# Patient Record
Sex: Male | Born: 1970 | Hispanic: Yes | Marital: Single | Smoking: Former smoker
Health system: Southern US, Community
[De-identification: ages and names within clinical notes are randomized; demographics above are authoritative.]

## PROBLEM LIST (undated history)

## (undated) DIAGNOSIS — K5790 Diverticulosis of intestine, part unspecified, without perforation or abscess without bleeding: Secondary | ICD-10-CM

## (undated) DIAGNOSIS — K5792 Diverticulitis of intestine, part unspecified, without perforation or abscess without bleeding: Secondary | ICD-10-CM

## (undated) DIAGNOSIS — E785 Hyperlipidemia, unspecified: Secondary | ICD-10-CM

## (undated) HISTORY — DX: Hyperlipidemia, unspecified: E78.5

## (undated) HISTORY — DX: Diverticulosis of intestine, part unspecified, without perforation or abscess without bleeding: K57.90

---

## 2011-03-01 ENCOUNTER — Inpatient Hospital Stay (INDEPENDENT_AMBULATORY_CARE_PROVIDER_SITE_OTHER)
Admission: RE | Admit: 2011-03-01 | Discharge: 2011-03-01 | Disposition: A | Discharge status: Home / Self Care | Payer: BC Managed Care – PPO | Source: Ambulatory Visit | Attending: Family Medicine | Admitting: Family Medicine

## 2011-03-01 ENCOUNTER — Emergency Department (HOSPITAL_COMMUNITY)
Admission: EM | Admit: 2011-03-01 | Discharge: 2011-03-01 | Disposition: A | Discharge status: Home / Self Care | Payer: BC Managed Care – PPO | Attending: Emergency Medicine | Admitting: Emergency Medicine

## 2011-03-01 ENCOUNTER — Emergency Department (HOSPITAL_COMMUNITY): Payer: BC Managed Care – PPO

## 2011-03-01 DIAGNOSIS — K5732 Diverticulitis of large intestine without perforation or abscess without bleeding: Secondary | ICD-10-CM | POA: Insufficient documentation

## 2011-03-01 DIAGNOSIS — D72829 Elevated white blood cell count, unspecified: Secondary | ICD-10-CM | POA: Insufficient documentation

## 2011-03-01 DIAGNOSIS — R109 Unspecified abdominal pain: Secondary | ICD-10-CM | POA: Insufficient documentation

## 2011-03-01 LAB — COMPREHENSIVE METABOLIC PANEL
ALT: 21 U/L (ref 0–53)
AST: 17 U/L (ref 0–37)
Albumin: 4.2 g/dL (ref 3.5–5.2)
Alkaline Phosphatase: 67 U/L (ref 39–117)
BUN: 13 mg/dL (ref 6–23)
CO2: 28 mEq/L (ref 19–32)
Calcium: 9.9 mg/dL (ref 8.4–10.5)
Chloride: 98 mEq/L (ref 96–112)
Creatinine, Ser: 0.75 mg/dL (ref 0.4–1.5)
GFR calc Af Amer: >60 mL/min (ref >60)
GFR calc non Af Amer: >60 mL/min (ref >60)
Glucose, Bld: 110 mg/dL — ABNORMAL HIGH (ref 70–99)
Potassium: 4.3 mEq/L (ref 3.5–5.1)
Sodium: 136 mEq/L (ref 135–145)
Total Bilirubin: 1.1 mg/dL (ref 0.3–1.2)
Total Protein: 7.9 g/dL (ref 6.0–8.3)

## 2011-03-01 LAB — CBC
HCT: 41.7 % (ref 39.0–52.0)
Hemoglobin: 14.4 g/dL (ref 13.0–17.0)
MCH: 30.8 pg (ref 26.0–34.0)
MCHC: 34.5 g/dL (ref 30.0–36.0)
MCV: 89.3 fL (ref 78.0–100.0)
Platelets: 246 10*3/uL (ref 150–400)
RBC: 4.67 MIL/uL (ref 4.22–5.81)
RDW: 13.7 % (ref 11.5–15.5)
WBC: 17.5 10*3/uL — ABNORMAL HIGH (ref 4.0–10.5)

## 2011-03-01 LAB — DIFFERENTIAL
Basophils Absolute: 0 10*3/uL (ref 0.0–0.1)
Basophils Relative: 0 % (ref 0–1)
Eosinophils Absolute: 0.1 10*3/uL (ref 0.0–0.7)
Eosinophils Relative: 1 % (ref 0–5)
Lymphocytes Relative: 11 % — ABNORMAL LOW (ref 12–46)
Lymphs Abs: 1.8 10*3/uL (ref 0.7–4.0)
Monocytes Absolute: 1.7 10*3/uL — ABNORMAL HIGH (ref 0.1–1.0)
Monocytes Relative: 10 % (ref 3–12)
Neutro Abs: 13.8 10*3/uL — ABNORMAL HIGH (ref 1.7–7.7)
Neutrophils Relative %: 79 % — ABNORMAL HIGH (ref 43–77)

## 2011-03-01 LAB — URINALYSIS, ROUTINE W REFLEX MICROSCOPIC
Bilirubin Urine: NEGATIVE
Glucose, UA: NEGATIVE mg/dL
Ketones, ur: NEGATIVE mg/dL
Leukocytes, UA: NEGATIVE
Nitrite: NEGATIVE
Protein, ur: NEGATIVE mg/dL
Specific Gravity, Urine: 1 — ABNORMAL LOW (ref 1.005–1.030)
Urobilinogen, UA: 0.2 mg/dL (ref 0.0–1.0)
pH: 7 (ref 5.0–8.0)

## 2011-03-01 LAB — URINE MICROSCOPIC-ADD ON

## 2011-03-01 MED ORDER — IOHEXOL 300 MG/ML  SOLN
100.0000 mL | Freq: Once | INTRAMUSCULAR | Status: AC | PRN
  Administered 2011-03-01: 100 mL via INTRAVENOUS

## 2012-08-24 ENCOUNTER — Encounter (HOSPITAL_COMMUNITY): Payer: Self-pay

## 2012-08-24 ENCOUNTER — Emergency Department (HOSPITAL_COMMUNITY)
Admission: EM | Admit: 2012-08-24 | Discharge: 2012-08-24 | Disposition: A | Discharge status: Home / Self Care | Payer: BC Managed Care – PPO | Source: Home / Self Care

## 2012-08-24 DIAGNOSIS — K5792 Diverticulitis of intestine, part unspecified, without perforation or abscess without bleeding: Secondary | ICD-10-CM

## 2012-08-24 DIAGNOSIS — K5732 Diverticulitis of large intestine without perforation or abscess without bleeding: Secondary | ICD-10-CM

## 2012-08-24 LAB — POCT URINALYSIS DIP (DEVICE)
Bilirubin Urine: NEGATIVE
Glucose, UA: NEGATIVE mg/dL
Specific Gravity, Urine: 1.02 (ref 1.005–1.030)

## 2012-08-24 LAB — OCCULT BLOOD, POC DEVICE: Fecal Occult Bld: POSITIVE

## 2012-08-24 MED ORDER — METRONIDAZOLE 500 MG PO TABS: 500.0000 mg | ORAL_TABLET | Freq: Two times a day (BID) | ORAL | Status: DC

## 2012-08-24 MED ORDER — CIPROFLOXACIN HCL 500 MG PO TABS: 500.0000 mg | ORAL_TABLET | Freq: Two times a day (BID) | ORAL | Status: DC

## 2012-08-24 NOTE — ED Provider Notes (Signed)
History     CSN: [id number]  Arrival date & time 08/24/12  1006   None     Chief Complaint  Patient presents with  . Abdominal Pain    (Consider location/radiation/quality/duration/timing/severity/associated sxs/prior treatment) HPI Comments: 41 year old Hispanic presents to the emergency department with abdominal pain for 4 days. He states the pain radiates from the left lower quadrant to the right lower quadrant. The pain is worse in the left lower quadrant. The pain is intermittent, comes and goes. Occasionally the pain is worse upon eating; and sometimes improved after a bowel movement. He denies vomiting, although 4 days ago he did have a couple of episodes of loose stools in which she called diarrhea. He denies fever or urinary symptoms except for equivocal urinary frequency. There is constant low level of pain particularly in the left lower quadrant. He denies rectal bleeding. He denies changes in stools or BMs. He states his bowel movements have been normal with formed stools. He visited the urgent care in May of 2012 with similar complaints of abdominal pain. He states this is different in that he only her in the left lower quadrant and the intensity was not as bad as it is today. A CT of the abdomen was obtained at that time and the interpretation was that of diverticulitis of the sigmoid colon.   History reviewed. No pertinent past medical history.  History reviewed. No pertinent past surgical history.  History reviewed. No pertinent family history.  History  Substance Use Topics  . Smoking status: Never Smoker   . Smokeless tobacco: Not on file  . Alcohol Use: No      Review of Systems  Constitutional: Positive for appetite change. Negative for fever, diaphoresis, activity change and fatigue.  HENT: Negative for ear pain, congestion, sore throat, neck pain and neck stiffness.   Eyes: Negative.   Respiratory: Negative for cough, chest tightness, shortness of breath  and wheezing.   Cardiovascular: Negative for chest pain, palpitations and leg swelling.  Gastrointestinal: Positive for abdominal pain and diarrhea. Negative for vomiting, constipation, abdominal distention and rectal pain.  Genitourinary: Positive for frequency. Negative for dysuria, urgency, hematuria, flank pain, difficulty urinating and penile pain.  Musculoskeletal: Negative.   Skin: Negative for color change and rash.  Neurological: Negative.   Psychiatric/Behavioral: Negative.  Negative for behavioral problems.    Allergies  Review of patient's allergies indicates not on file.  Home Medications   Current Outpatient Rx  Name  Route  Sig  Dispense  Refill  . CIPROFLOXACIN HCL 500 MG PO TABS   Oral   Take 1 tablet (500 mg total) by mouth 2 (two) times daily.   14 tablet   0   . METRONIDAZOLE 500 MG PO TABS   Oral   Take 1 tablet (500 mg total) by mouth 2 (two) times daily. X 7 days   14 tablet   0     BP 130/82  Pulse 68  Temp 97.9 F (36.6 C) (Oral)  Resp 18  SpO2 99%  Physical Exam  Constitutional: He is oriented to person, place, and time. He appears well-developed and well-nourished. No distress.  HENT:  Head: Normocephalic and atraumatic.  Eyes: Conjunctivae normal and EOM are normal.  Neck: Normal range of motion. Neck supple.  Cardiovascular: Normal rate, regular rhythm and normal heart sounds.   Pulmonary/Chest: Effort normal and breath sounds normal. No respiratory distress. He has no wheezes.  Abdominal: Soft. He exhibits no distension and no mass.  There is tenderness. There is no rebound and no guarding.       Reproducible tenderness at the left lower quadrant. No tenderness in the mid or right lower quadrant. No tenderness above the umbilicus. Bowel sounds are normal active.  Musculoskeletal: Normal range of motion. He exhibits no edema and no tenderness.  Neurological: He is alert and oriented to person, place, and time. No cranial nerve deficit.    Skin: Skin is warm and dry. No rash noted.  Psychiatric: He has a normal mood and affect.    ED Course  Procedures (including critical care time)  Labs Reviewed  POCT URINALYSIS DIP (DEVICE) - Abnormal; Notable for the following:    Hgb urine dipstick TRACE (*)     All other components within normal limits  OCCULT BLOOD X 1 CARD TO LAB, STOOL   No results found.   1. Diverticulitis       MDM  Hemoccult was trace positive for blood. With the CT reading of May 2012 interpretation as diverticulitis and a similar clinical presentation today is most likely an exacerbation of his sigmoid diverticulitis. Flagyl 500 mg twice a day for 7 days Cipro 500 mg twice a day for 7 days Instructions on diet for diverticulitis Used to locate a PCP for followup; to return if needed or if worse go to the emergency department.  Results for orders placed during the hospital encounter of 08/24/12  POCT URINALYSIS DIP (DEVICE)      Component Value Range   Glucose, UA NEGATIVE  NEGATIVE mg/dL   Bilirubin Urine NEGATIVE  NEGATIVE   Ketones, ur NEGATIVE  NEGATIVE mg/dL   Specific Gravity, Urine 1.020  1.005 - 1.030   Hgb urine dipstick TRACE (*) NEGATIVE   pH 7.0  5.0 - 8.0   Protein, ur NEGATIVE  NEGATIVE mg/dL   Urobilinogen, UA 0.2  0.0 - 1.0 mg/dL   Nitrite NEGATIVE  NEGATIVE   Leukocytes, UA NEGATIVE  NEGATIVE         Hayden Rasmussen, NP 08/24/12 1117

## 2012-08-24 NOTE — ED Notes (Addendum)
C/o generalized abdominal pain since last week, mostly transverse abdominal area , and a lot of gas. Denies n/v/d, no one else in home ill  States he has not taken any medication for pain , and the pain is "8' on 1-10 scale; NAD, w/d/color good, calm conversant

## 2012-08-24 NOTE — ED Notes (Signed)
discussion regarding medication precautions, diet precautions w patient and brother

## 2012-08-25 NOTE — ED Provider Notes (Signed)
Medical screening examination/treatment/procedure(s) were performed by resident physician or non-physician practitioner and as supervising physician I was immediately available for consultation/collaboration.   Barkley Bruns MD.    Linna Hoff, MD 08/25/12 [phone]

## 2015-11-09 ENCOUNTER — Encounter (HOSPITAL_COMMUNITY): Payer: Self-pay | Admitting: *Deleted

## 2015-11-09 ENCOUNTER — Encounter (HOSPITAL_COMMUNITY): Payer: Self-pay | Admitting: Emergency Medicine

## 2015-11-09 ENCOUNTER — Emergency Department (INDEPENDENT_AMBULATORY_CARE_PROVIDER_SITE_OTHER)
Admission: EM | Admit: 2015-11-09 | Discharge: 2015-11-09 | Disposition: A | Discharge status: Nursing Home (Includes ICF) | Payer: BLUE CROSS/BLUE SHIELD | Source: Home / Self Care | Attending: Family Medicine | Admitting: Family Medicine

## 2015-11-09 DIAGNOSIS — R1032 Left lower quadrant pain: Secondary | ICD-10-CM | POA: Diagnosis not present

## 2015-11-09 DIAGNOSIS — K5732 Diverticulitis of large intestine without perforation or abscess without bleeding: Secondary | ICD-10-CM | POA: Diagnosis not present

## 2015-11-09 HISTORY — DX: Diverticulitis of intestine, part unspecified, without perforation or abscess without bleeding: K57.92

## 2015-11-09 LAB — COMPREHENSIVE METABOLIC PANEL
ALBUMIN: 4.3 g/dL (ref 3.5–5.0)
ALK PHOS: 67 U/L (ref 38–126)
ALT: 29 U/L (ref 17–63)
ANION GAP: 10 (ref 5–15)
AST: 22 U/L (ref 15–41)
BILIRUBIN TOTAL: 0.7 mg/dL (ref 0.3–1.2)
BUN: 11 mg/dL (ref 6–20)
CALCIUM: 9.5 mg/dL (ref 8.9–10.3)
CO2: 26 mmol/L (ref 22–32)
CREATININE: 0.78 mg/dL (ref 0.61–1.24)
Chloride: 102 mmol/L (ref 101–111)
GFR calc Af Amer: >60 mL/min (ref >60)
GFR calc non Af Amer: >60 mL/min (ref >60)
GLUCOSE: 112 mg/dL — AB (ref 65–99)
Potassium: 3.9 mmol/L (ref 3.5–5.1)
Sodium: 138 mmol/L (ref 135–145)
TOTAL PROTEIN: 7.9 g/dL (ref 6.5–8.1)

## 2015-11-09 LAB — URINALYSIS, ROUTINE W REFLEX MICROSCOPIC
Bilirubin Urine: NEGATIVE
GLUCOSE, UA: NEGATIVE mg/dL
Ketones, ur: NEGATIVE mg/dL
Leukocytes, UA: NEGATIVE
NITRITE: NEGATIVE
PROTEIN: NEGATIVE mg/dL
Specific Gravity, Urine: 1.008 (ref 1.005–1.030)
pH: 6.5 (ref 5.0–8.0)

## 2015-11-09 LAB — CBC
HCT: 44.2 % (ref 39.0–52.0)
Hemoglobin: 14.8 g/dL (ref 13.0–17.0)
MCH: 30.6 pg (ref 26.0–34.0)
MCHC: 33.5 g/dL (ref 30.0–36.0)
MCV: 91.3 fL (ref 78.0–100.0)
PLATELETS: 265 10*3/uL (ref 150–400)
RBC: 4.84 MIL/uL (ref 4.22–5.81)
RDW: 14 % (ref 11.5–15.5)
WBC: 10.8 10*3/uL — ABNORMAL HIGH (ref 4.0–10.5)

## 2015-11-09 LAB — URINE MICROSCOPIC-ADD ON: WBC, UA: NONE SEEN WBC/hpf (ref 0–5)

## 2015-11-09 LAB — LIPASE, BLOOD: Lipase: 27 U/L (ref 11–51)

## 2015-11-09 NOTE — ED Notes (Signed)
Abdominal pain and blood in stool.  Pain for 2 days

## 2015-11-09 NOTE — ED Notes (Signed)
Pt c/o left lower abdominal pain for two days. Pt denies n/v/d/, constipation. Pt states it "feels like there is a knot in my stomach"

## 2015-11-09 NOTE — ED Provider Notes (Signed)
CSN: [id number]     Arrival date & time 11/09/15  1815 History   First MD Initiated Contact with Patient 11/09/15 1946     Chief Complaint  Patient presents with  . Abdominal Pain   (Consider location/radiation/quality/duration/timing/severity/associated sxs/prior Treatment) HPI Comments: 20 sure O male complaining of pain across the lower abdomen. Pain is greatest on the left. It is often worse with having bowel movement. He describes the pain as a tight feeling. It is intermittent. It started approximately 2 days ago and has been worsening over the past 48 hours. The pain often awakens him at night. He denies diarrhea or constipation. Denies nausea or vomiting. Denies fevers. He does admit to having blood in the stool today only. In looking at his old medical records after being transferred to the emergency department for abdominal pain had a history of diverticulitis with similar abdominal pain with leukocytosis and 2012. The history is provided by the patient. The history is limited by a language barrier. A language interpreter was used.    Past Medical History  Diagnosis Date  . Diverticulitis    History reviewed. No pertinent past surgical history. No family history on file. Social History  Substance Use Topics  . Smoking status: Never Smoker   . Smokeless tobacco: None  . Alcohol Use: No    Review of Systems  Constitutional: Positive for activity change. Negative for fever and fatigue.  HENT: Negative.   Respiratory: Negative.  Negative for cough and shortness of breath.   Cardiovascular: Negative.  Negative for chest pain.  Gastrointestinal: Positive for abdominal pain and anal bleeding. Negative for nausea, vomiting, diarrhea and constipation.  Genitourinary: Negative.   Musculoskeletal: Negative.   Skin: Negative.   Neurological: Negative.     Allergies  Review of patient's allergies indicates not on file.  Home Medications   Prior to Admission medications    Medication Sig Start Date End Date Taking? Authorizing Provider  ciprofloxacin (CIPRO) 500 MG tablet Take 1 tablet (500 mg total) by mouth 2 (two) times daily. Patient not taking: Reported on 11/09/2015 08/24/12   Hayden Rasmussen, NP  ciprofloxacin (CIPRO) 500 MG tablet Take 1 tablet (500 mg total) by mouth 2 (two) times daily. Patient not taking: Reported on 11/09/2015 08/24/12   Hayden Rasmussen, NP  metroNIDAZOLE (FLAGYL) 500 MG tablet Take 1 tablet (500 mg total) by mouth 2 (two) times daily. X 7 days Patient not taking: Reported on 11/09/2015 08/24/12   Hayden Rasmussen, NP   Meds Ordered and Administered this Visit  Medications - No data to display  BP 134/85 mmHg  Pulse 68  Temp(Src) 98.2 F (36.8 C) (Oral)  Resp 16  SpO2 99% No data found.   Physical Exam  Constitutional: He is oriented to person, place, and time. He appears well-developed and well-nourished. No distress.  Eyes: EOM are normal.  Neck: Normal range of motion. Neck supple.  Cardiovascular: Normal rate, regular rhythm, normal heart sounds and intact distal pulses.   Pulmonary/Chest: Effort normal and breath sounds normal. No respiratory distress. He has no wheezes. He has no rales.  Abdominal: Soft. Bowel sounds are normal. He exhibits no distension and no mass.  Tenderness to the left lower quadrant. No rebound or guarding.  Musculoskeletal: He exhibits no edema or tenderness.  Neurological: He is alert and oriented to person, place, and time. He exhibits normal muscle tone.  Skin: Skin is warm and dry.  Psychiatric: He has a normal mood and affect.  Nursing note  and vitals reviewed.   ED Course  Procedures (including critical care time)  Labs Review Labs Reviewed - No data to display  Imaging Review No results found.   Visual Acuity Review  Right Eye Distance:   Left Eye Distance:   Bilateral Distance:    Right Eye Near:   Left Eye Near:    Bilateral Near:         MDM   1. Left lower quadrant pain     [facility] for evaluation of left lower quadrant pain progressing over 2 days. History of diverticulitis and recent reported blood in stool. Patient is stable and may go by private vehicle or by shuttle.    Hayden Rasmussen, NP 11/09/15 2017

## 2015-11-10 ENCOUNTER — Emergency Department (HOSPITAL_COMMUNITY)
Admission: EM | Admit: 2015-11-10 | Discharge: 2015-11-10 | Disposition: A | Discharge status: Home / Self Care | Payer: BLUE CROSS/BLUE SHIELD | Attending: Emergency Medicine | Admitting: Emergency Medicine

## 2015-11-10 DIAGNOSIS — K5733 Diverticulitis of large intestine without perforation or abscess with bleeding: Secondary | ICD-10-CM

## 2015-11-10 MED ORDER — METRONIDAZOLE 500 MG PO TABS: 500.0000 mg | ORAL_TABLET | Freq: Three times a day (TID) | ORAL | Status: DC

## 2015-11-10 MED ORDER — CIPROFLOXACIN HCL 500 MG PO TABS: 500.0000 mg | ORAL_TABLET | Freq: Two times a day (BID) | ORAL | Status: DC

## 2015-11-10 NOTE — Discharge Instructions (Signed)
Diverticulitis (Diverticulitis) Zachary Cox, take antibiotics for treatment.  See a primary care doctor within 3 days for close follow up.  Come back to the ED immediately if symptoms worsen. Thank you.   Zachary Cox, tome antibiticos para el tratamiento. Consulte a un mdico de atencin primaria en un plazo de 3 das para un seguimiento cercano. Volver a la DE inmediatamente si los sntomas empeoran. Gracias.  La diverticulitis ocurre cuando pequeos bolsillos que se han formado en el colon (intestino grueso) se infectan o se inflaman. CUIDADOS EN EL HOGAR  Siga las indicaciones del mdico.  Siga una dieta especial si el mdico se lo indic.  Cuando se sienta mejor, el mdico puede indicarle que cambie la dieta. Tal vez le indiquen que coma gran cantidad de [place]. Las frutas y los vegetales son buenas fuentes de [place]. La fibra facilita la evacuacin intestinal (defecacin).  Tome los suplementos o los probiticos como le indic el mdico.  Tome los medicamentos solamente como se lo haya indicado el mdico.  Cumpla con todas las visitas de control con su mdico. SOLICITE AYUDA SI:  El dolor no mejora.  Le resulta difcil alimentarse.  No defeca como lo hace normalmente. SOLICITE AYUDA DE INMEDIATO SI:  El dolor empeora.  Los problemas no mejoran.  Los problemas empeoran repentinamente.  Tiene fiebre.  No deja de vomitar.  La materia fecal (heces) es sanguinolenta o negra, de aspecto alquitranado. ASEGRESE DE QUE:   Comprende estas instrucciones.  Controlar su afeccin.  Recibir ayuda de inmediato si no mejora o si empeora.   Esta informacin no tiene Theme park manager el consejo del mdico. Asegrese de hacerle al mdico cualquier pregunta que tenga.   Document Released: 09/22/2011 Document Revised: 10/08/2013 Elsevier Interactive Patient Education 2016 [email or web address]. ngl

## 2015-11-10 NOTE — ED Provider Notes (Signed)
CSN: [id number]     Arrival date & time 11/09/15  2026 History   By signing my name below, I, Zachary Cox, attest that this documentation has been prepared under the direction and in the presence of Tomasita Crumble, MD.   Electronically Signed: Iona Cox, ED Scribe. 11/10/2015. 2:17 AM    Chief Complaint  Patient presents with  . Abdominal Pain     The history is provided by the patient and a relative. The history is limited by a language barrier. No language interpreter was used.   HPI Comments: Zachary Cox is a 45 y.o. male with PMHx of diverticulitis in 2012 who presents to the Emergency Department complaining of gradual onset, constant, pinching lower abdominal pain worse on left side, onset two days ago. Pt reports associated dysuria with itching and burning, hematochezia, and central low back pain.  No worsening or alleviating factors reported. He has not taken any medication for pain treatment. Pt denies vomiting, diarrhea, fever, and hematuria.  Patient sent from [facility] for evaluation.   Past Medical History  Diagnosis Date  . Diverticulitis    History reviewed. No pertinent past surgical history. History reviewed. No pertinent family history. Social History  Substance Use Topics  . Smoking status: Never Smoker   . Smokeless tobacco: None  . Alcohol Use: No    Review of Systems 10 Systems reviewed and all are negative for acute change except as noted in the HPI.   Allergies  Review of patient's allergies indicates not on file.  Home Medications   Prior to Admission medications   Medication Sig Start Date End Date Taking? Authorizing Provider  ciprofloxacin (CIPRO) 500 MG tablet Take 1 tablet (500 mg total) by mouth 2 (two) times daily. Patient not taking: Reported on 11/09/2015 08/24/12   Hayden Rasmussen, NP  ciprofloxacin (CIPRO) 500 MG tablet Take 1 tablet (500 mg total) by mouth 2 (two) times daily. Patient not taking: Reported on 11/09/2015 08/24/12   Hayden Rasmussen, NP  metroNIDAZOLE (FLAGYL) 500 MG tablet Take 1 tablet (500 mg total) by mouth 2 (two) times daily. X 7 days Patient not taking: Reported on 11/09/2015 08/24/12   Hayden Rasmussen, NP   BP 124/80 mmHg  Pulse 73  Temp(Src) 98.2 F (36.8 C) (Oral)  Resp 14  SpO2 99% Physical Exam  Constitutional: He is oriented to person, place, and time. Vital signs are normal. He appears well-developed and well-nourished.  Non-toxic appearance. He does not appear ill. No distress.  HENT:  Head: Normocephalic and atraumatic.  Nose: Nose normal.  Mouth/Throat: Oropharynx is clear and moist. No oropharyngeal exudate.  Eyes: Conjunctivae and EOM are normal. Pupils are equal, round, and reactive to light. No scleral icterus.  Neck: Normal range of motion. Neck supple. No tracheal deviation, no edema, no erythema and normal range of motion present. No thyroid mass and no thyromegaly present.  Cardiovascular: Normal rate, regular rhythm, S1 normal, S2 normal, normal heart sounds, intact distal pulses and normal pulses.  Exam reveals no gallop and no friction rub.   No murmur heard. Pulmonary/Chest: Effort normal and breath sounds normal. No respiratory distress. He has no wheezes. He has no rhonchi. He has no rales.  Abdominal: Soft. Normal appearance and bowel sounds are normal. He exhibits no distension, no ascites and no mass. There is no hepatosplenomegaly. There is tenderness. There is no rebound, no guarding and no CVA tenderness.  LLQ TTP  Musculoskeletal: Normal range of motion. He exhibits no edema or tenderness.  Lymphadenopathy:    He has no cervical adenopathy.  Neurological: He is alert and oriented to person, place, and time. He has normal strength. No cranial nerve deficit or sensory deficit.  Skin: Skin is warm, dry and intact. No petechiae and no rash noted. He is not diaphoretic. No erythema. No pallor.  Psychiatric: He has a normal mood and affect. His behavior is normal. Judgment normal.   Nursing note and vitals reviewed.   ED Course  Procedures  DIAGNOSTIC STUDIES: Oxygen Saturation is 99% on RA, normal by my interpretation.    COORDINATION OF CARE:   2:05 AM-Discussed treatment plan which includes ciprofloxacin and flagyl with pt at bedside and pt agreed to plan.  Labs Review Labs Reviewed  COMPREHENSIVE METABOLIC PANEL - Abnormal; Notable for the following:    Glucose, Bld 112 (*)    All other components within normal limits  CBC - Abnormal; Notable for the following:    WBC 10.8 (*)    All other components within normal limits  URINALYSIS, ROUTINE W REFLEX MICROSCOPIC (NOT AT [facility]) - Abnormal; Notable for the following:    APPearance CLOUDY (*)    Hgb urine dipstick TRACE (*)    All other components within normal limits  URINE MICROSCOPIC-ADD ON - Abnormal; Notable for the following:    Squamous Epithelial / LPF 0-5 (*)    Bacteria, UA RARE (*)    All other components within normal limits  LIPASE, BLOOD    Imaging Review No results found. I have personally reviewed and evaluated these lab results as part of my medical decision-making.   EKG Interpretation None      MDM   Final diagnoses:  None   Patient presents emergency department for left lower quadrant abdominal pain and blood in his stool. He does have history of diverticulitis seen on CT scan 2012, his history appears to be consistent with this. Physical exam only reveals mild tenderness in that area. I do not believe repeat CT scan is warranted. Will treat with antibiotics and strict return precautions were given to the patient. He currently appears well in no acute distress, vital signs were within his normal limits and he is safe for discharge.   I personally performed the services described in this documentation, which was scribed in my presence. The recorded information has been reviewed and is accurate.       Tomasita Crumble, MD 11/10/15 [phone]

## 2017-07-18 DIAGNOSIS — T7840XA Allergy, unspecified, initial encounter: Secondary | ICD-10-CM | POA: Insufficient documentation

## 2018-03-28 ENCOUNTER — Ambulatory Visit (INDEPENDENT_AMBULATORY_CARE_PROVIDER_SITE_OTHER): Payer: BLUE CROSS/BLUE SHIELD

## 2018-03-28 ENCOUNTER — Ambulatory Visit (INDEPENDENT_AMBULATORY_CARE_PROVIDER_SITE_OTHER): Payer: BLUE CROSS/BLUE SHIELD | Admitting: Orthopaedic Surgery

## 2018-03-28 ENCOUNTER — Encounter (INDEPENDENT_AMBULATORY_CARE_PROVIDER_SITE_OTHER): Payer: Self-pay | Admitting: Orthopaedic Surgery

## 2018-03-28 DIAGNOSIS — G8929 Other chronic pain: Secondary | ICD-10-CM | POA: Diagnosis not present

## 2018-03-28 DIAGNOSIS — M25512 Pain in left shoulder: Secondary | ICD-10-CM | POA: Diagnosis not present

## 2018-03-28 DIAGNOSIS — M25561 Pain in right knee: Secondary | ICD-10-CM

## 2018-03-28 MED ORDER — BUPIVACAINE HCL 0.25 % IJ SOLN
2.0000 mL | INTRAMUSCULAR | Status: AC | PRN
  Administered 2018-03-28: 2 mL via INTRA_ARTICULAR

## 2018-03-28 MED ORDER — METHYLPREDNISOLONE ACETATE 40 MG/ML IJ SUSP
40.0000 mg | INTRAMUSCULAR | Status: AC | PRN
  Administered 2018-03-28: 40 mg via INTRA_ARTICULAR

## 2018-03-28 MED ORDER — LIDOCAINE HCL 1 % IJ SOLN
2.0000 mL | INTRAMUSCULAR | Status: AC | PRN
  Administered 2018-03-28: 2 mL

## 2018-03-28 MED ORDER — LIDOCAINE HCL 2 % IJ SOLN
2.0000 mL | INTRAMUSCULAR | Status: AC | PRN
  Administered 2018-03-28: 2 mL

## 2018-03-28 NOTE — Progress Notes (Signed)
Office Visit Note   Patient: Zachary BrimCesar Pendry           Date of Birth: [birth year]           MRN: [id number] Visit Date: 03/28/2018              Requested by: No referring provider defined for this encounter. PCP: Kurtis BushmanGonzalez, Rodalyn, PA   Assessment & Plan: Visit Diagnoses:  1. Chronic pain of right knee   2. Chronic left shoulder pain     Plan: Impression is right knee pain and left shoulder subacromial bursitis.  In regards to both of these, we will received with a right knee intra-articular cortisone injection and a left shoulder subacromial cortisone injection.  I will also provide the patient with a job exercise program.  He will follow-up with us if he is not any better in the next few weeks.  Call with concerns or questions in the meantime.  Follow-Up Instructions: Return if symptoms worsen or fail to improve.   Orders:  Orders Placed This Encounter  Procedures  . Large Joint Inj: R knee  . Large Joint Inj: L subacromial bursa  . XR KNEE 3 VIEW RIGHT  . XR Shoulder Left   No orders of the defined types were placed in this encounter.     Procedures: Large Joint Inj: R knee on 03/28/2018 2:56 PM Indications: pain Details: 22 G needle, anterolateral approach Medications: 2 mL lidocaine 1 %; 2 mL bupivacaine 0.25 %; 40 mg methylPREDNISolone acetate 40 MG/ML  Large Joint Inj: L subacromial bursa on 03/28/2018 2:57 PM Indications: pain Details: 22 G needle Medications: 2 mL lidocaine 2 %; 2 mL bupivacaine 0.25 %; 40 mg methylPREDNISolone acetate 40 MG/ML Outcome: tolerated well, no immediate complications Patient was prepped and draped in the usual sterile fashion.       Clinical Data: No additional findings.   Subjective: Chief Complaint  Patient presents with  . Left Shoulder - Pain  . Right Knee - Pain    HPI patient is a pleasant 47 year old gentleman who presents to our clinic today with a Spanish interpreter.  He comes in for his right knee as well as  left shoulder.  In regards to the right knee, this is been bothering him for the past 6 months.  No known injury or change in activity.  His pain has worsened.  The pain he does have as to the entire knee.  He describes this as a constant ache worse when he is lying down for long periods of time or at the end of the day.  He has taken Advil in the past with moderate relief of symptoms.  Of note, he does stand for long hours each day while at work.  In regards to the left shoulder, this is been bothering him for the past 3 months without any known injury or change in activity.  It has not worsened but the pain has not improved.  The pain he has is to the anterior aspect.  This is intermittent in nature and is worse with forward flexion, abduction as well as bringing his arm across his body.  No numbness, tingling or burning.  No previous cortisone injection.  Review of Systems as detailed in HPI.  All others reviewed and are negative.   Objective: Vital Signs: There were no vitals taken for this visit.  Physical Exam well-developed well-nourished gentleman no acute distress.  Alert and oriented x3.  Ortho Exam examination of his right  knee shows range of motion from 0 to 125 degrees 1+ patella femoral crepitus.  Minimal tenderness medial joint line.  He is stable to valgus and varus stress.   examination of his left shoulder reveals full active range of motion in all planes.  Negative empty can.  Positive cross body abduction.  Specialty Comments:  No specialty comments available.  Imaging: Xr Knee 3 View Right  Result Date: 03/28/2018 No acute or structural abnormalities  Xr Shoulder Left  Result Date: 03/28/2018 No acute or structural abnormalities    PMFS History: Patient Active Problem List   Diagnosis Date Noted  . Chronic pain of right knee 03/28/2018  . Chronic left shoulder pain 03/28/2018   Past Medical History:  Diagnosis Date  . Diverticulitis     History reviewed. No  pertinent family history.  History reviewed. No pertinent surgical history. Social History   Occupational History  . Not on file  Tobacco Use  . Smoking status: Never Smoker  . Smokeless tobacco: Never Used  Substance and Sexual Activity  . Alcohol use: No  . Drug use: No  . Sexual activity: Not on file

## 2018-05-30 ENCOUNTER — Ambulatory Visit (INDEPENDENT_AMBULATORY_CARE_PROVIDER_SITE_OTHER): Payer: BLUE CROSS/BLUE SHIELD | Admitting: Orthopaedic Surgery

## 2018-05-30 DIAGNOSIS — G8929 Other chronic pain: Secondary | ICD-10-CM

## 2018-05-30 DIAGNOSIS — M25561 Pain in right knee: Secondary | ICD-10-CM

## 2018-05-30 DIAGNOSIS — M25512 Pain in left shoulder: Secondary | ICD-10-CM

## 2018-05-30 MED ORDER — LIDOCAINE HCL 1 % IJ SOLN
3.0000 mL | INTRAMUSCULAR | Status: AC | PRN
  Administered 2018-05-30: 3 mL

## 2018-05-30 MED ORDER — METHYLPREDNISOLONE ACETATE 40 MG/ML IJ SUSP
40.0000 mg | INTRAMUSCULAR | Status: AC | PRN
  Administered 2018-05-30: 40 mg via INTRA_ARTICULAR

## 2018-05-30 MED ORDER — BUPIVACAINE HCL 0.5 % IJ SOLN
3.0000 mL | INTRAMUSCULAR | Status: AC | PRN
  Administered 2018-05-30: 3 mL via INTRA_ARTICULAR

## 2018-05-30 NOTE — Progress Notes (Signed)
   Office Visit Note   Patient: Zachary BrimCesar Betker           Date of Birth: Feb 26, 1971           MRN: [id number] Visit Date: 05/30/2018              Requested by: Kurtis BushmanGonzalez, Rodalyn, PA [address] [place], [place] [id number] PCP: Kurtis BushmanGonzalez, Rodalyn, PA   Assessment & Plan: Visit Diagnoses:  1. Chronic left shoulder pain   2. Chronic pain of right knee     Plan: Impression is left shoulder biceps tendinitis for which we performed an injection today.  Patient tolerates well.  For the right knee patient has failed conservative treatment has chronic pain.  I am concerned that he has a medial meniscal tear.  We will order MRI to assess for this.  Follow-up after the MRI.  Follow-Up Instructions: Return in about 10 days (around 06/09/2018).   Orders:  No orders of the defined types were placed in this encounter.  No orders of the defined types were placed in this encounter.     Procedures: Large Joint Inj: L glenohumeral on 05/30/2018 1:43 PM Indications: pain Details: 22 G needle  Arthrogram: No  Medications: 3 mL lidocaine 1 %; 3 mL bupivacaine 0.5 %; 40 mg methylPREDNISolone acetate 40 MG/ML Outcome: tolerated well, no immediate complications Patient was prepped and draped in the usual sterile fashion.       Clinical Data: No additional findings.   Subjective: Chief Complaint  Patient presents with  . Left Shoulder - Pain, Follow-up    Patient follows up today for continued left shoulder pain and right knee pain.  His right knee pain is localized to the medial joint line.  He does endorse clicking without significant pain or swelling.  No giving way or locking.  His left shoulder hurts more anteriorly.  He denies any numbness and tingling.   Review of Systems  Constitutional: Negative.   All other systems reviewed and are negative.    Objective: Vital Signs: There were no vitals taken for this visit.  Physical Exam  Constitutional: He is oriented to  person, place, and time. He appears well-developed and well-nourished.  Pulmonary/Chest: Effort normal.  Abdominal: Soft.  Neurological: He is alert and oriented to person, place, and time.  Skin: Skin is warm.  Psychiatric: He has a normal mood and affect. His behavior is normal. Judgment and thought content normal.  Nursing note and vitals reviewed.   Ortho Exam Left shoulder exam shows tenderness of the bicipital groove.  Rotator cuff testing is normal.  Labral testing is normal. Right knee exam shows no joint effusion.  Medial joint line tenderness.  Negative McMurray. Specialty Comments:  No specialty comments available.  Imaging: No results found.   PMFS History: Patient Active Problem List   Diagnosis Date Noted  . Chronic pain of right knee 03/28/2018  . Chronic left shoulder pain 03/28/2018   Past Medical History:  Diagnosis Date  . Diverticulitis     No family history on file.  No past surgical history on file. Social History   Occupational History  . Not on file  Tobacco Use  . Smoking status: Never Smoker  . Smokeless tobacco: Never Used  Substance and Sexual Activity  . Alcohol use: No  . Drug use: No  . Sexual activity: Not on file

## 2018-09-11 DIAGNOSIS — Z8719 Personal history of other diseases of the digestive system: Secondary | ICD-10-CM | POA: Insufficient documentation

## 2018-10-23 ENCOUNTER — Ambulatory Visit
Admission: RE | Admit: 2018-10-23 | Discharge: 2018-10-23 | Disposition: A | Discharge status: Home / Self Care | Payer: BLUE CROSS/BLUE SHIELD | Source: Ambulatory Visit | Attending: Orthopaedic Surgery | Admitting: Orthopaedic Surgery

## 2018-10-23 DIAGNOSIS — M25561 Pain in right knee: Principal | ICD-10-CM

## 2018-10-23 DIAGNOSIS — G8929 Other chronic pain: Secondary | ICD-10-CM

## 2018-10-23 NOTE — Progress Notes (Signed)
Needs appt

## 2018-10-31 ENCOUNTER — Encounter (INDEPENDENT_AMBULATORY_CARE_PROVIDER_SITE_OTHER): Payer: Self-pay | Admitting: Orthopaedic Surgery

## 2018-10-31 ENCOUNTER — Ambulatory Visit (INDEPENDENT_AMBULATORY_CARE_PROVIDER_SITE_OTHER): Payer: BLUE CROSS/BLUE SHIELD | Admitting: Orthopaedic Surgery

## 2018-10-31 DIAGNOSIS — S83241A Other tear of medial meniscus, current injury, right knee, initial encounter: Secondary | ICD-10-CM

## 2018-10-31 NOTE — Progress Notes (Signed)
   Office Visit Note   Patient: Zachary Cox           Date of Birth: [birth year]           MRN: [id number] Visit Date: 10/31/2018              Requested by: Kurtis Bushman, PA [address] [place], [place] [address] PCP: Kurtis Bushman, PA   Assessment & Plan: Visit Diagnoses:  1. Acute medial meniscus tear, right, initial encounter     Plan: Impression is symptomatic medial meniscal tear.  The MRI results were discussed with the patient.  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  We discussed the surgery in detail including the risks and benefits and rehab and recovery.  He understands and will talk to his job about taking time off for surgery and then will call us back.  Questions encouraged and answered today.  Today's encounter was performed through an interpreter.  Total face to face encounter time was greater than 25 minutes and over half of this time was spent in counseling and/or coordination of care.  Follow-Up Instructions: Return for 1 week postop visit.   Orders:  No orders of the defined types were placed in this encounter.  No orders of the defined types were placed in this encounter.     Procedures: No procedures performed   Clinical Data: No additional findings.   Subjective: No chief complaint on file.   Zachary Cox returns today for review of his right knee MRI.  He states that there is no change in his symptoms.  He continues to have point tenderness and sharp stabbing pain on the medial side of his knee with weightbearing.  He also continues to have swelling.   Review of Systems   Objective: Vital Signs: There were no vitals taken for this visit.  Physical Exam  Ortho Exam Right knee exam shows medial joint line tenderness with small joint effusion. Specialty Comments:  No specialty comments available.  Imaging: No results found.   PMFS History: Patient  Active Problem List   Diagnosis Date Noted  . Acute medial meniscus tear, right, initial encounter 10/31/2018  . Chronic pain of right knee 03/28/2018  . Chronic left shoulder pain 03/28/2018   Past Medical History:  Diagnosis Date  . Diverticulitis     No family history on file.  No past surgical history on file. Social History   Occupational History  . Not on file  Tobacco Use  . Smoking status: Never Smoker  . Smokeless tobacco: Never Used  Substance and Sexual Activity  . Alcohol use: No  . Drug use: No  . Sexual activity: Not on file

## 2018-12-06 ENCOUNTER — Encounter (HOSPITAL_BASED_OUTPATIENT_CLINIC_OR_DEPARTMENT_OTHER): Payer: Self-pay | Admitting: *Deleted

## 2018-12-06 ENCOUNTER — Other Ambulatory Visit: Payer: Self-pay

## 2018-12-12 ENCOUNTER — Ambulatory Visit (HOSPITAL_BASED_OUTPATIENT_CLINIC_OR_DEPARTMENT_OTHER)
Admission: RE | Admit: 2018-12-12 | Discharge: 2018-12-12 | Disposition: A | Discharge status: Home / Self Care | Payer: BLUE CROSS/BLUE SHIELD | Attending: Orthopaedic Surgery | Admitting: Orthopaedic Surgery

## 2018-12-12 ENCOUNTER — Other Ambulatory Visit: Payer: Self-pay

## 2018-12-12 ENCOUNTER — Ambulatory Visit (HOSPITAL_BASED_OUTPATIENT_CLINIC_OR_DEPARTMENT_OTHER): Payer: BLUE CROSS/BLUE SHIELD | Admitting: Certified Registered"

## 2018-12-12 ENCOUNTER — Encounter (HOSPITAL_BASED_OUTPATIENT_CLINIC_OR_DEPARTMENT_OTHER): Payer: Self-pay | Admitting: Certified Registered"

## 2018-12-12 ENCOUNTER — Encounter (HOSPITAL_BASED_OUTPATIENT_CLINIC_OR_DEPARTMENT_OTHER)
Admission: RE | Disposition: A | Discharge status: Home / Self Care | Payer: Self-pay | Source: Home / Self Care | Attending: Orthopaedic Surgery

## 2018-12-12 ENCOUNTER — Encounter: Payer: Self-pay | Admitting: Orthopaedic Surgery

## 2018-12-12 DIAGNOSIS — X58XXXA Exposure to other specified factors, initial encounter: Secondary | ICD-10-CM | POA: Insufficient documentation

## 2018-12-12 DIAGNOSIS — Z79899 Other long term (current) drug therapy: Secondary | ICD-10-CM | POA: Insufficient documentation

## 2018-12-12 DIAGNOSIS — M659 Synovitis and tenosynovitis, unspecified: Secondary | ICD-10-CM

## 2018-12-12 DIAGNOSIS — Z888 Allergy status to other drugs, medicaments and biological substances status: Secondary | ICD-10-CM | POA: Diagnosis not present

## 2018-12-12 DIAGNOSIS — M65961 Unspecified synovitis and tenosynovitis, right lower leg: Secondary | ICD-10-CM

## 2018-12-12 DIAGNOSIS — S83241A Other tear of medial meniscus, current injury, right knee, initial encounter: Secondary | ICD-10-CM

## 2018-12-12 DIAGNOSIS — M94261 Chondromalacia, right knee: Secondary | ICD-10-CM | POA: Insufficient documentation

## 2018-12-12 DIAGNOSIS — K219 Gastro-esophageal reflux disease without esophagitis: Secondary | ICD-10-CM | POA: Insufficient documentation

## 2018-12-12 HISTORY — PX: KNEE ARTHROSCOPY WITH MEDIAL MENISECTOMY: SHX5651

## 2018-12-12 SURGERY — ARTHROSCOPY, KNEE, WITH MEDIAL MENISCECTOMY
Anesthesia: General | Site: Knee | Laterality: Right

## 2018-12-12 MED ORDER — ONDANSETRON HCL 4 MG/2ML IJ SOLN: 4.0000 mg | Freq: Once | INTRAMUSCULAR | Status: DC | PRN

## 2018-12-12 MED ORDER — ONDANSETRON HCL 4 MG/2ML IJ SOLN
INTRAMUSCULAR | Status: DC | PRN
  Administered 2018-12-12: 4 mg via INTRAVENOUS

## 2018-12-12 MED ORDER — CHLORHEXIDINE GLUCONATE 4 % EX LIQD: 60.0000 mL | Freq: Once | CUTANEOUS | Status: DC

## 2018-12-12 MED ORDER — FENTANYL CITRATE (PF) 100 MCG/2ML IJ SOLN
50.0000 ug | INTRAMUSCULAR | Status: DC | PRN
  Administered 2018-12-12: 100 ug via INTRAVENOUS

## 2018-12-12 MED ORDER — FENTANYL CITRATE (PF) 100 MCG/2ML IJ SOLN: 25.0000 ug | INTRAMUSCULAR | Status: DC | PRN

## 2018-12-12 MED ORDER — MIDAZOLAM HCL 2 MG/2ML IJ SOLN
INTRAMUSCULAR | Status: AC
  Filled 2018-12-12: qty 2

## 2018-12-12 MED ORDER — SCOPOLAMINE 1 MG/3DAYS TD PT72: 1.0000 | MEDICATED_PATCH | Freq: Once | TRANSDERMAL | Status: DC | PRN

## 2018-12-12 MED ORDER — PROPOFOL 10 MG/ML IV BOLUS
INTRAVENOUS | Status: DC | PRN
  Administered 2018-12-12: 200 mg via INTRAVENOUS

## 2018-12-12 MED ORDER — FENTANYL CITRATE (PF) 100 MCG/2ML IJ SOLN
INTRAMUSCULAR | Status: AC
  Filled 2018-12-12: qty 2

## 2018-12-12 MED ORDER — ONDANSETRON HCL 4 MG PO TABS: 4.0000 mg | ORAL_TABLET | Freq: Three times a day (TID) | ORAL | 0 refills | Status: DC | PRN

## 2018-12-12 MED ORDER — MIDAZOLAM HCL 2 MG/2ML IJ SOLN
1.0000 mg | INTRAMUSCULAR | Status: DC | PRN
  Administered 2018-12-12: 2 mg via INTRAVENOUS

## 2018-12-12 MED ORDER — HYDROCODONE-ACETAMINOPHEN 5-325 MG PO TABS: 1.0000 | ORAL_TABLET | Freq: Four times a day (QID) | ORAL | 0 refills | Status: DC | PRN

## 2018-12-12 MED ORDER — BUPIVACAINE HCL (PF) 0.25 % IJ SOLN
INTRAMUSCULAR | Status: DC | PRN
  Administered 2018-12-12: 20 mL

## 2018-12-12 MED ORDER — LACTATED RINGERS IV SOLN
INTRAVENOUS | Status: DC
  Administered 2018-12-12 (×2): via INTRAVENOUS

## 2018-12-12 MED ORDER — DEXAMETHASONE SODIUM PHOSPHATE 10 MG/ML IJ SOLN
INTRAMUSCULAR | Status: DC | PRN
  Administered 2018-12-12: 10 mg via INTRAVENOUS

## 2018-12-12 MED ORDER — LIDOCAINE HCL (CARDIAC) PF 100 MG/5ML IV SOSY
PREFILLED_SYRINGE | INTRAVENOUS | Status: DC | PRN
  Administered 2018-12-12: 60 mg via INTRAVENOUS

## 2018-12-12 MED ORDER — ACETAMINOPHEN 500 MG PO TABS
1000.0000 mg | ORAL_TABLET | Freq: Once | ORAL | Status: AC
  Administered 2018-12-12: 1000 mg via ORAL

## 2018-12-12 MED ORDER — SODIUM CHLORIDE 0.9 % IR SOLN
Status: DC | PRN
  Administered 2018-12-12: 3000 mL

## 2018-12-12 MED ORDER — CEFAZOLIN SODIUM-DEXTROSE 2-4 GM/100ML-% IV SOLN
INTRAVENOUS | Status: AC
  Filled 2018-12-12: qty 100

## 2018-12-12 MED ORDER — ACETAMINOPHEN 500 MG PO TABS
ORAL_TABLET | ORAL | Status: AC
  Filled 2018-12-12: qty 2

## 2018-12-12 MED ORDER — LACTATED RINGERS IV SOLN: INTRAVENOUS | Status: DC

## 2018-12-12 MED ORDER — CEFAZOLIN SODIUM-DEXTROSE 2-4 GM/100ML-% IV SOLN
2.0000 g | INTRAVENOUS | Status: AC
  Administered 2018-12-12: 2 g via INTRAVENOUS

## 2018-12-12 SURGICAL SUPPLY — 36 items
BANDAGE ACE 6X5 VEL STRL LF (GAUZE/BANDAGES/DRESSINGS) ×4 IMPLANT
BANDAGE ESMARK 6X9 LF (GAUZE/BANDAGES/DRESSINGS) IMPLANT
BLADE 4.2CUDA (BLADE) IMPLANT
BLADE CUDA GRT WHITE 3.5 (BLADE) IMPLANT
BLADE CUDA SHAVER 3.5 (BLADE) IMPLANT
BLADE CUTTER GATOR 3.5 (BLADE) IMPLANT
BLADE GREAT WHITE 4.2 (BLADE) IMPLANT
BNDG ESMARK 6X9 LF (GAUZE/BANDAGES/DRESSINGS)
COVER WAND RF STERILE (DRAPES) IMPLANT
CUFF TOURNIQUET SINGLE 34IN LL (TOURNIQUET CUFF) ×2 IMPLANT
DRAPE ARTHROSCOPY W/POUCH 90 (DRAPES) ×2 IMPLANT
DRAPE IMP U-DRAPE 54X76 (DRAPES) ×2 IMPLANT
DRAPE U-SHAPE 47X51 STRL (DRAPES) ×2 IMPLANT
GAUZE SPONGE 4X4 12PLY STRL (GAUZE/BANDAGES/DRESSINGS) ×2 IMPLANT
GAUZE XEROFORM 1X8 LF (GAUZE/BANDAGES/DRESSINGS) ×2 IMPLANT
GLOVE BIOGEL PI IND STRL 7.0 (GLOVE) ×3 IMPLANT
GLOVE BIOGEL PI INDICATOR 7.0 (GLOVE) ×3
GLOVE ECLIPSE 7.0 STRL STRAW (GLOVE) ×4 IMPLANT
GLOVE SKINSENSE NS SZ7.5 (GLOVE) ×1
GLOVE SKINSENSE STRL SZ7.5 (GLOVE) ×1 IMPLANT
GLOVE SURG SYN 7.5  E (GLOVE) ×1
GLOVE SURG SYN 7.5 E (GLOVE) ×1 IMPLANT
GOWN STRL REIN XL XLG (GOWN DISPOSABLE) ×2 IMPLANT
GOWN STRL REUS W/ TWL LRG LVL3 (GOWN DISPOSABLE) ×1 IMPLANT
GOWN STRL REUS W/ TWL XL LVL3 (GOWN DISPOSABLE) ×1 IMPLANT
GOWN STRL REUS W/TWL LRG LVL3 (GOWN DISPOSABLE) ×1
GOWN STRL REUS W/TWL XL LVL3 (GOWN DISPOSABLE) ×1
KNEE WRAP E Z 3 GEL PACK (MISCELLANEOUS) ×2 IMPLANT
MANIFOLD NEPTUNE II (INSTRUMENTS) ×2 IMPLANT
PACK ARTHROSCOPY DSU (CUSTOM PROCEDURE TRAY) ×2 IMPLANT
PACK BASIN DAY SURGERY FS (CUSTOM PROCEDURE TRAY) ×2 IMPLANT
RESECTOR FULL RADIUS 4.2MM (BLADE) IMPLANT
SHAVER 4.2 MM LANZA 9391A (BLADE) ×2 IMPLANT
SUT ETHILON 3 0 PS 1 (SUTURE) ×2 IMPLANT
TOWEL GREEN STERILE FF (TOWEL DISPOSABLE) ×2 IMPLANT
TUBING ARTHRO INFLOW-ONLY STRL (TUBING) ×2 IMPLANT

## 2018-12-12 NOTE — Anesthesia Procedure Notes (Signed)
Procedure Name: LMA Insertion Date/Time: 12/12/2018 9:55 AM Performed by: Sheryn Bison, CRNA Pre-anesthesia Checklist: Patient identified, Emergency Drugs available, Suction available and Patient being monitored Patient Re-evaluated:Patient Re-evaluated prior to induction Oxygen Delivery Method: Circle system utilized Preoxygenation: Pre-oxygenation with 100% oxygen Induction Type: IV induction Ventilation: Mask ventilation without difficulty LMA: LMA inserted LMA Size: 4.0 Number of attempts: 1 Airway Equipment and Method: Bite block Placement Confirmation: positive ETCO2 Tube secured with: Tape Dental Injury: Teeth and Oropharynx as per pre-operative assessment

## 2018-12-12 NOTE — Transfer of Care (Signed)
Immediate Anesthesia Transfer of Care Note  Patient: Zachary Cox  Procedure(s) Performed: RIGHT KNEE ARTHROSCOPY WITH PARTIAL MEDIAL MENISCECTOMY (Right Knee)  Patient Location: PACU  Anesthesia Type:General  Level of Consciousness: drowsy and patient cooperative  Airway & Oxygen Therapy: Patient Spontanous Breathing and Patient connected to face mask oxygen  Post-op Assessment: Report given to RN and Post -op Vital signs reviewed and stable  Post vital signs: Reviewed and stable  Last Vitals:  Vitals Value Taken Time  BP    Temp    Pulse 65 12/12/2018 10:35 AM  Resp 10 12/12/2018 10:35 AM  SpO2 97 % 12/12/2018 10:35 AM  Vitals shown include unvalidated device data.  Last Pain:  Vitals:   12/12/18 0845  TempSrc: Oral  PainSc: 0-No pain      Patients Stated Pain Goal: 0 (12/12/18 0845)  Complications: No apparent anesthesia complications

## 2018-12-12 NOTE — Anesthesia Preprocedure Evaluation (Signed)
Anesthesia Evaluation  Patient identified by MRN, date of birth, ID band Patient awake    Reviewed: Allergy & Precautions, NPO status , Patient's Chart, lab work & pertinent test results  Airway Mallampati: II  TM Distance: >3 FB Neck ROM: Full    Dental  (+) Dental Advisory Given   Pulmonary neg pulmonary ROS,    breath sounds clear to auscultation       Cardiovascular negative cardio ROS   Rhythm:Regular Rate:Normal     Neuro/Psych negative neurological ROS  negative psych ROS   GI/Hepatic Neg liver ROS, GERD  ,  Endo/Other  negative endocrine ROS  Renal/GU negative Renal ROS  negative genitourinary   Musculoskeletal negative musculoskeletal ROS (+)   Abdominal   Peds negative pediatric ROS (+)  Hematology negative hematology ROS (+)   Anesthesia Other Findings   Reproductive/Obstetrics negative OB ROS                             Anesthesia Physical Anesthesia Plan  ASA: I  Anesthesia Plan: General   Post-op Pain Management:    Induction: Intravenous  PONV Risk Score and Plan: 2 and Dexamethasone, Ondansetron and Treatment may vary due to age or medical condition  Airway Management Planned: LMA  Additional Equipment:   Intra-op Plan:   Post-operative Plan: Extubation in OR  Informed Consent: I have reviewed the patients History and Physical, chart, labs and discussed the procedure including the risks, benefits and alternatives for the proposed anesthesia with the patient or authorized representative who has indicated his/her understanding and acceptance.     Dental advisory given  Plan Discussed with: CRNA  Anesthesia Plan Comments:         Anesthesia Quick Evaluation

## 2018-12-12 NOTE — Discharge Instructions (Signed)
° ° °Post-operative patient instructions  °Knee Arthroscopy  ° °• Ice:  Place intermittent ice or cooler pack over your knee, 30 minutes on and 30 minutes off.  Continue this for the first 72 hours after surgery, then save ice for use after therapy sessions or on more active days.   °• Weight:  You may bear weight on your leg as your symptoms allow. °• Crutches:  Use crutches (or walker) to assist in walking until told to discontinue by your physical therapist or physician. This will help to reduce pain. °• Strengthening:  Perform simple thigh squeezes (isometric quad contractions) and straight leg lifts as you are able (3 sets of 5 to 10 repetitions, 3 times a day).  For the leg lifts, have someone support under your ankle in the beginning until you have increased strength enough to do this on your own.  To help get started on thigh squeezes, place a pillow under your knee and push down on the pillow with back of knee (sometimes easier to do than with your leg fully straight). °• Motion:  Perform gentle knee motion as tolerated - this is gentle bending and straightening of the knee. Seated heel slides: you can start by sitting in a chair, remove your brace, and gently slide your heel back on the floor - allowing your knee to bend. Have someone help you straighten your knee (or use your other leg/foot hooked under your ankle.  °• Dressing:  Perform 1st dressing change at 2 days postoperative. A moderate amount of blood tinged drainage is to be expected.  So if you bleed through the dressing on the first or second day or if you have fevers, it is fine to change the dressing/check the wounds early and redress wound. Elevate your leg.  If it bleeds through again, or if the incisions are leaking frank blood, please call the office. May change dressing every 1-2 days thereafter to help watch wounds. Can purchase Tegaderm (or 3M Nexcare) water resistant dressings at local pharmacy / [facility]. °• Shower:  Light shower is  ok after 2 days.  Please take shower, NO bath. Recover with gauze and ace wrap to help keep wounds protected.   °• Pain medication:  A narcotic pain medication has been prescribed.  Take as directed.  Typically you need narcotic pain medication more regularly during the first 3 to 5 days after surgery.  Decrease your use of the medication as the pain improves.  Narcotics can sometimes cause constipation, even after a few doses.  If you have problems with constipation, you can take an over the counter stool softener or light laxative.  If you have persistent problems, please notify your physician’s office. °• Physical therapy: Additional activity guidelines to be provided by your physician or physical therapist at follow-up visits.  °• Driving: Do not recommend driving x 2 weeks post surgical, especially if surgery performed on right side. Should not drive while taking narcotic pain medications. It typically takes at least 2 weeks to restore sufficient neuromuscular function for normal reaction times for driving safety.  °• Call [phone] for questions or problems. Evenings you will be forwarded to the hospital operator.  Ask for the orthopaedic physician on call. Please call if you experience:  °  °o Redness, foul smelling, or persistent drainage from the surgical site  °o worsening knee pain and swelling not responsive to medication  °o any calf pain and or swelling of the lower leg  °o temperatures greater than   101.5 F o other questions or concerns   Thank you for allowing [place] to be a part of your care.    Post Anesthesia Home Care Instructions  NO TYLENOL UNTIL AFTER 330PM 12/12/2018.  Activity: Get plenty of rest for the remainder of the day. A responsible individual must stay with you for 24 hours following the procedure.  For the next 24 hours, DO NOT: -Drive a car -Advertising copywriter -Drink alcoholic beverages -Take any medication unless instructed by your physician -Make any legal  decisions or sign important papers.  Meals: Start with liquid foods such as gelatin or soup. Progress to regular foods as tolerated. Avoid greasy, spicy, heavy foods. If nausea and/or vomiting occur, drink only clear liquids until the nausea and/or vomiting subsides. Call your physician if vomiting continues.  Special Instructions/Symptoms: Your throat may feel dry or sore from the anesthesia or the breathing tube placed in your throat during surgery. If this causes discomfort, gargle with warm salt water. The discomfort should disappear within 24 hours.  If you had a scopolamine patch placed behind your ear for the management of post- operative nausea and/or vomiting:  1. The medication in the patch is effective for 72 hours, after which it should be removed.  Wrap patch in a tissue and discard in the trash. Wash hands thoroughly with soap and water. 2. You may remove the patch earlier than 72 hours if you experience unpleasant side effects which may include dry mouth, dizziness or visual disturbances. 3. Avoid touching the patch. Wash your hands with soap and water after contact with the patch.

## 2018-12-12 NOTE — Anesthesia Postprocedure Evaluation (Signed)
Anesthesia Post Note  Patient: Zachary Cox  Procedure(s) Performed: RIGHT KNEE ARTHROSCOPY WITH PARTIAL MEDIAL MENISCECTOMY (Right Knee)     Patient location during evaluation: PACU Anesthesia Type: General Level of consciousness: awake and alert Pain management: pain level controlled Vital Signs Assessment: post-procedure vital signs reviewed and stable Respiratory status: spontaneous breathing, nonlabored ventilation, respiratory function stable and patient connected to nasal cannula oxygen Cardiovascular status: blood pressure returned to baseline and stable Postop Assessment: no apparent nausea or vomiting Anesthetic complications: no    Last Vitals:  Vitals:   12/12/18 1110 12/12/18 1115  BP:  120/78  Pulse: 73 72  Resp: 13 18  Temp:    SpO2: 97% 98%    Last Pain:  Vitals:   12/12/18 1115  TempSrc:   PainSc: 0-No pain        RLE Motor Response: Purposeful movement (12/12/18 1115) RLE Sensation: Full sensation (12/12/18 1115)      Kennieth Rad

## 2018-12-12 NOTE — Op Note (Signed)
   Surgery Date: 12/12/2018  Surgeon(s): Tarry Kos, MD  ASSIST: Oneal Grout, [place]; necessary for the timely completion of procedure and due to complexity of procedure.  ANESTHESIA:  general  FLUIDS: Per anesthesia record.   ESTIMATED BLOOD LOSS: minimal  PREOPERATIVE DIAGNOSES:  1. Right knee medial meniscus tear 2. Right knee synovitis  POSTOPERATIVE DIAGNOSES:  same  PROCEDURES PERFORMED:  1. Right knee arthroscopy with major synovectomy 2. Right knee arthroscopy with arthroscopic partial medial meniscectomy 3. Right knee arthroscopy with arthroscopic chondroplasty medial femoral condyle and trochlea.  DESCRIPTION OF PROCEDURE: Zachary Cox is a 48 y.o.-year-old male with right knee medial meniscus tear. Plans are to proceed with partial medial meniscectomy and diagnostic arthroscopy with debridement as indicated. Full discussion held regarding risks benefits alternatives and complications related surgical intervention. Conservative care options reviewed. All questions answered.  The patient was identified in the preoperative holding area and the operative extremity was marked. The patient was brought to the operating room and transferred to operating table in a supine position. Satisfactory general anesthesia was induced by anesthesiology.    Standard anterolateral, anteromedial arthroscopy portals were obtained. The anteromedial portal was obtained with a spinal needle for localization under direct visualization with subsequent diagnostic findings.   Incisions were made for arthroscopy portals.  Diagnostic knee arthroscopy was first performed.  A major synovectomy was performed in all 3 compartments of the knee with a oscillating shaver.  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  Gentle chondroplasty was performed for this.  We then placed the knee in a valgus stress to access the medial  meniscus.  Using a probe we were able to identify the oblique tear which was then treated with a partial medial meniscectomy with a meniscus basket and oscillating shaver back to a stable border.  We then addressed the lateral compartment which was unremarkable.  The knee was then placed in full extension and the femoral trochlea exhibited grade III chondromalacia.  Chondroplasty was performed for this area.  His patella exhibited mild lateral tracking.  Excess fluid was then drained from the knee.  Incisions were closed with interrupted nylon sutures.  Sterile dressings were applied.  Patient tolerated procedure well had no immediate complications.  Suprapatellar pouch and gutters: moderate synovitis or debris. Patella chondral surface: Grade 2 Trochlear chondral surface: Grade 3 Patellofemoral tracking: slightly lateral Medial meniscus: oblique displaced tear.  Medial femoral condyle flexion bearing surface: Grade 3 Medial femoral condyle extension bearing surface: Grade 2 Medial tibial plateau: Grade 2 Anterior cruciate ligament:stable Posterior cruciate ligament:stable Lateral meniscus: normal.   Lateral femoral condyle flexion bearing surface: Grade 0 Lateral femoral condyle extension bearing surface: Grade 0 Lateral tibial plateau: Grade 0  DISPOSITION: The patient was awakened from general anesthetic, extubated, taken to the recovery room in medically stable condition, no apparent complications. The patient may be weightbearing as tolerated to the operative lower extremity.  Range of motion of right knee as tolerated.  Mayra Reel, MD [facility] [phone] 10:26 AM

## 2018-12-12 NOTE — H&P (Signed)
    PREOPERATIVE H&P  Chief Complaint: right knee medial meniscal tear  HPI: Zachary Cox is a 48 y.o. male who presents for surgical treatment of right knee medial meniscal tear.  He denies any changes in medical history.  Past Medical History:  Diagnosis Date  . Diverticulitis    History reviewed. No pertinent surgical history. Social History   Socioeconomic History  . Marital status: Single    Spouse name: Not on file  . Number of children: Not on file  . Years of education: Not on file  . Highest education level: Not on file  Occupational History  . Not on file  Social Needs  . Financial resource strain: Not on file  . Food insecurity:    Worry: Not on file    Inability: Not on file  . Transportation needs:    Medical: Not on file    Non-medical: Not on file  Tobacco Use  . Smoking status: Never Smoker  . Smokeless tobacco: Never Used  Substance and Sexual Activity  . Alcohol use: No  . Drug use: No  . Sexual activity: Not on file  Lifestyle  . Physical activity:    Days per week: Not on file    Minutes per session: Not on file  . Stress: Not on file  Relationships  . Social connections:    Talks on phone: Not on file    Gets together: Not on file    Attends religious service: Not on file    Active member of club or organization: Not on file    Attends meetings of clubs or organizations: Not on file    Relationship status: Not on file  Other Topics Concern  . Not on file  Social History Narrative  . Not on file   History reviewed. No pertinent family history. Allergies  Allergen Reactions  . Blue Dyes (Parenteral)   . Red Dye   . Voltaren [Diclofenac] Rash   Prior to Admission medications   Medication Sig Start Date End Date Taking? Authorizing Provider  omeprazole (PRILOSEC) 20 MG capsule Take 20 mg by mouth daily.   Yes [provider]     Positive ROS: All other systems have been reviewed and were otherwise negative with the  exception of those mentioned in the HPI and as above.  Physical Exam: General: Alert, no acute distress Cardiovascular: No pedal edema Respiratory: No cyanosis, no use of accessory musculature GI: abdomen soft Skin: No lesions in the area of chief complaint Neurologic: Sensation intact distally Psychiatric: Patient is competent for consent with normal mood and affect Lymphatic: no lymphedema  MUSCULOSKELETAL: exam stable  Assessment: right knee medial meniscal tear  Plan: Plan for Procedure(s): RIGHT KNEE ARTHROSCOPY WITH PARTIAL MEDIAL MENISCECTOMY  The risks benefits and alternatives were discussed with the patient including but not limited to the risks of nonoperative treatment, versus surgical intervention including infection, bleeding, nerve injury,  blood clots, cardiopulmonary complications, morbidity, mortality, among others, and they were willing to proceed.   Glee Arvin, MD   12/12/2018 7:30 AM

## 2018-12-13 ENCOUNTER — Encounter (HOSPITAL_BASED_OUTPATIENT_CLINIC_OR_DEPARTMENT_OTHER): Payer: Self-pay | Admitting: Orthopaedic Surgery

## 2018-12-17 ENCOUNTER — Telehealth (INDEPENDENT_AMBULATORY_CARE_PROVIDER_SITE_OTHER): Payer: Self-pay | Admitting: Orthopaedic Surgery

## 2018-12-17 NOTE — Telephone Encounter (Signed)
PT called stating she gave the wrong fax #  The correct fax is [phone]

## 2018-12-17 NOTE — Telephone Encounter (Signed)
For what? I don't see previous message?

## 2018-12-19 ENCOUNTER — Ambulatory Visit (INDEPENDENT_AMBULATORY_CARE_PROVIDER_SITE_OTHER): Payer: BLUE CROSS/BLUE SHIELD | Admitting: Physician Assistant

## 2018-12-19 ENCOUNTER — Encounter (INDEPENDENT_AMBULATORY_CARE_PROVIDER_SITE_OTHER): Payer: Self-pay | Admitting: Orthopaedic Surgery

## 2018-12-19 DIAGNOSIS — Z9889 Other specified postprocedural states: Secondary | ICD-10-CM

## 2018-12-19 NOTE — Progress Notes (Signed)
   Post-Op Visit Note   Patient: Zachary Cox           Date of Birth: 09/08/1971           MRN: [id number] Visit Date: 12/19/2018 PCP: Kurtis Bushman, PA   Assessment & Plan:  Chief Complaint:  Chief Complaint  Patient presents with  . Right Knee - Pain   Visit Diagnoses:  1. S/P right knee arthroscopy     Plan: Patient is a pleasant 48 year old Spanish-speaking gentleman who presents to our clinic today 7 days status post right knee arthroscopic debridement medial meniscus, date of surgery 12/12/2018.  He has been doing fairly well since surgery.  He has been ambulating with 1 crutch.  No fevers or chills.  Very minimal to no pain.  Examination of his right knee reveals well healed surgical portals with nylon sutures in place.  Calf is soft nontender.  At this point, nylon sutures were removed.  We will start the patient in formal physical therapy.  He will follow-up with [place] in 5 weeks time for recheck.  He does have a work note to provide him out of work for another week.  He is unsure whether he has a light duty or desk work option but will call and let us know.  If he does not have these options, he may need to be out for a few more weeks.  He will call and let us know.  This was all discussed with a Spanish speaking interpreter.  Follow-Up Instructions: Return in about 5 weeks (around 01/23/2019).   Orders:  No orders of the defined types were placed in this encounter.  No orders of the defined types were placed in this encounter.   Imaging: No new imaging  PMFS History: Patient Active Problem List   Diagnosis Date Noted  . S/P right knee arthroscopy 12/19/2018  . Synovitis of right knee 12/12/2018  . Acute medial meniscus tear of right knee 10/31/2018  . Chronic left shoulder pain 03/28/2018   Past Medical History:  Diagnosis Date  . Diverticulitis     History reviewed. No pertinent family history.  Past Surgical History:  Procedure Laterality Date  . KNEE  ARTHROSCOPY WITH MEDIAL MENISECTOMY Right 12/12/2018   Procedure: RIGHT KNEE ARTHROSCOPY WITH PARTIAL MEDIAL MENISCECTOMY;  Surgeon: Tarry Kos, MD;  Location: [facility];  Service: Orthopedics;  Laterality: Right;   Social History   Occupational History  . Not on file  Tobacco Use  . Smoking status: Never Smoker  . Smokeless tobacco: Never Used  Substance and Sexual Activity  . Alcohol use: No  . Drug use: No  . Sexual activity: Not on file

## 2018-12-21 NOTE — Telephone Encounter (Signed)
Pt wasn't clear with what the fax # was for.

## 2018-12-24 ENCOUNTER — Telehealth (INDEPENDENT_AMBULATORY_CARE_PROVIDER_SITE_OTHER): Payer: Self-pay | Admitting: Orthopaedic Surgery

## 2018-12-24 NOTE — Telephone Encounter (Signed)
Please advise. Thanks.  

## 2018-12-24 NOTE — Telephone Encounter (Signed)
Called pt no answer, left vm. Will try again.

## 2018-12-24 NOTE — Telephone Encounter (Signed)
Can you please call him and let him know that note is at front desk for him to pick up.

## 2018-12-24 NOTE — Telephone Encounter (Signed)
New Message  Pt is wanting to know if he can get a letter to be out of work for two weeks.  [phone]

## 2018-12-24 NOTE — Telephone Encounter (Signed)
yes

## 2018-12-27 ENCOUNTER — Ambulatory Visit: Payer: BLUE CROSS/BLUE SHIELD | Attending: Orthopaedic Surgery | Admitting: Physical Therapy

## 2019-01-16 ENCOUNTER — Telehealth (INDEPENDENT_AMBULATORY_CARE_PROVIDER_SITE_OTHER): Payer: Self-pay | Admitting: Orthopaedic Surgery

## 2019-01-16 NOTE — Telephone Encounter (Signed)
Patient's daughter called and asked if either you or Dr. Roda Shutters speak to Sid Falcon, she is with the patient's disability company and needs to speak to someone from the office.  The daughter did not say what it was in regards to.  Amanda's telephone # is [phone].  Thank you.

## 2019-01-16 NOTE — Telephone Encounter (Signed)
Called Patient back no answer. LMOM to return our call.  We need to know what we need to speak to Zachary Cox about. If its something regarding disability paperwork then this will be [place].

## 2019-01-16 NOTE — Telephone Encounter (Signed)
Called Marchelle Folks to see whats needed no answer LMOM to return call.

## 2019-01-23 ENCOUNTER — Ambulatory Visit (INDEPENDENT_AMBULATORY_CARE_PROVIDER_SITE_OTHER): Payer: BLUE CROSS/BLUE SHIELD | Admitting: Orthopaedic Surgery

## 2019-01-24 ENCOUNTER — Ambulatory Visit (INDEPENDENT_AMBULATORY_CARE_PROVIDER_SITE_OTHER): Payer: BLUE CROSS/BLUE SHIELD | Admitting: Orthopaedic Surgery

## 2019-03-15 ENCOUNTER — Ambulatory Visit: Payer: Self-pay | Admitting: Orthopaedic Surgery

## 2019-03-21 ENCOUNTER — Other Ambulatory Visit: Payer: Self-pay

## 2019-03-21 ENCOUNTER — Ambulatory Visit (INDEPENDENT_AMBULATORY_CARE_PROVIDER_SITE_OTHER): Payer: BC Managed Care – PPO | Admitting: Orthopaedic Surgery

## 2019-03-21 ENCOUNTER — Encounter: Payer: Self-pay | Admitting: Orthopaedic Surgery

## 2019-03-21 DIAGNOSIS — G8929 Other chronic pain: Secondary | ICD-10-CM | POA: Diagnosis not present

## 2019-03-21 DIAGNOSIS — M25561 Pain in right knee: Secondary | ICD-10-CM | POA: Diagnosis not present

## 2019-03-21 MED ORDER — DICLOFENAC SODIUM 1 % TD GEL: 2.0000 g | Freq: Four times a day (QID) | TRANSDERMAL | 1 refills | Status: DC

## 2019-03-21 NOTE — Progress Notes (Signed)
Office Visit Note   Patient: Zachary Cox           Date of Birth: 08/04/1971           MRN: [id number] Visit Date: 03/21/2019              Requested by: Kurtis Bushman, PA [address] [place], [place] [address] PCP: Kurtis Bushman, PA   Assessment & Plan: Visit Diagnoses:  1. Chronic pain of right knee     Plan: Impression is right knee iliotibial band syndrome.  I provided the patient with a stretching program and have reinforced with him that he needs to do this for 6 to 8 weeks.  Have also called in Voltaren gel to use as needed.  If he is not any better over the next 6 to 8 weeks, he will follow-up with [place].  This was all discussed with a Spanish-speaking interpreter who was present during the entire encounter.  Follow-Up Instructions: Return if symptoms worsen or fail to improve.   Orders:  No orders of the defined types were placed in this encounter.  Meds ordered this encounter  Medications   diclofenac sodium (VOLTAREN) 1 % GEL    Sig: Apply 2 g topically 4 (four) times daily.    Dispense:  1 Tube    Refill:  1      Procedures: No procedures performed   Clinical Data: No additional findings.   Subjective: Chief Complaint  Patient presents with   Right Knee - Routine Post Op    HPI patient is a pleasant 48 year old Spanish-speaking gentleman who presents our clinic today with recurrent right knee pain.  History of right knee arthroscopic debridement medial meniscus and chondroplasty, date of surgery 12/12/2018.  It was noted during operative intervention he had grade 3 changes throughout the patellofemoral and medial compartments.  He returned to work 6 weeks postop and was doing well until approximately 2 weeks ago.  No new injury or change in activity.  He is started to notice pain, popping and tightness to the lateral aspect of the right knee.  Worse with flexion of the knee as well as with pivoting and external rotation of the hip.  He has  been taking over-the-counter medications without significant relief of symptoms.  He denies any numbness, tingling or burning.  No weakness.  Review of Systems as detailed in HPI.  All others reviewed and are negative.   Objective: Vital Signs: There were no vitals taken for this visit.  Physical Exam well-developed well-nourished gentleman in no acute distress.  Alert and oriented x3.  Ortho Exam examination of his right knee shows a trace effusion.  Range of motion 0 to 120 degrees.  No joint line tenderness.  Stable valgus varus stress.  He does have moderate tenderness over the iliotibial band at the lateral femoral condyle.  He has moderate pain and stiffness to the knee with external rotation of the hip.  He is neurovascularly intact distally.  Specialty Comments:  No specialty comments available.  Imaging: No new imaging   PMFS History: Patient Active Problem List   Diagnosis Date Noted   S/P right knee arthroscopy 12/19/2018   Synovitis of right knee 12/12/2018   Acute medial meniscus tear of right knee 10/31/2018   Chronic pain of right knee 03/28/2018   Chronic left shoulder pain 03/28/2018   Past Medical History:  Diagnosis Date   Diverticulitis     History reviewed. No pertinent family history.  Past Surgical History:  Procedure Laterality Date   KNEE ARTHROSCOPY WITH MEDIAL MENISECTOMY Right 12/12/2018   Procedure: RIGHT KNEE ARTHROSCOPY WITH PARTIAL MEDIAL MENISCECTOMY;  Surgeon: Tarry KosXu, Nickolaos Brallier M, MD;  Location: [facility];  Service: Orthopedics;  Laterality: Right;   Social History   Occupational History   Not on file  Tobacco Use   Smoking status: Never Smoker   Smokeless tobacco: Never Used  Substance and Sexual Activity   Alcohol use: No   Drug use: No   Sexual activity: Not on file

## 2019-04-02 DIAGNOSIS — R432 Parageusia: Secondary | ICD-10-CM | POA: Insufficient documentation

## 2019-04-02 DIAGNOSIS — R0600 Dyspnea, unspecified: Secondary | ICD-10-CM | POA: Insufficient documentation

## 2019-05-03 DIAGNOSIS — K600 Acute anal fissure: Secondary | ICD-10-CM | POA: Insufficient documentation

## 2019-05-03 DIAGNOSIS — E781 Pure hyperglyceridemia: Secondary | ICD-10-CM | POA: Insufficient documentation

## 2019-05-18 DIAGNOSIS — K648 Other hemorrhoids: Secondary | ICD-10-CM | POA: Insufficient documentation

## 2020-02-01 ENCOUNTER — Ambulatory Visit: Payer: BC Managed Care – PPO

## 2020-10-19 DIAGNOSIS — Z8616 Personal history of COVID-19: Secondary | ICD-10-CM | POA: Insufficient documentation

## 2020-12-06 IMAGING — MR MR KNEE*R* W/O CM
7 series · 37 of 40 positions shown · non-contrast
Comparison: None.

CLINICAL DATA: Right knee pain medially with swelling and popping.

EXAM:
MRI OF THE RIGHT KNEE WITHOUT CONTRAST
TECHNIQUE: Multiplanar, multisequence MR imaging of the knee was performed. No
intravenous contrast was administered.

[Series 6: T2 fat-sat · axial · right · 4.0mm · 0.53mm/px · z∈[-86,+67]mm · 7 of 36 slices shown (1 of 3)]
[im 1/36]
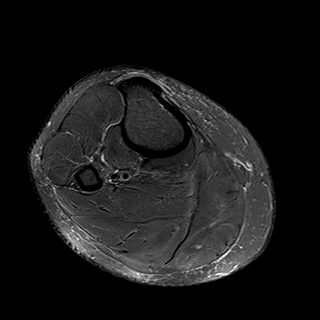
[im 6/36]
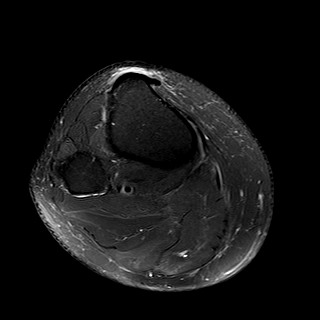
[im 12/36]
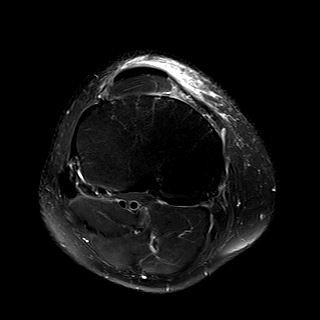
[im 18/36]
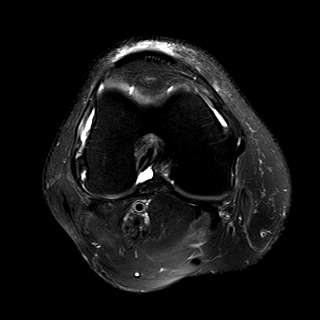
[im 24/36]
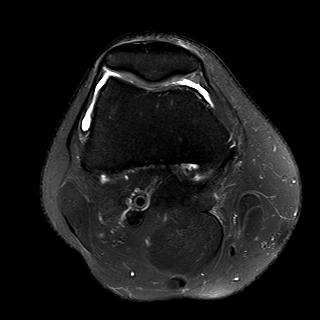
[im 30/36]
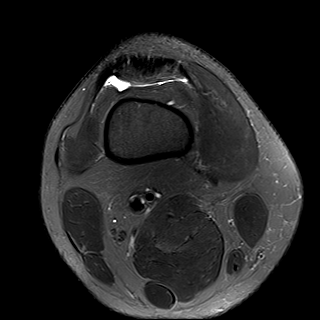
[im 36/36]
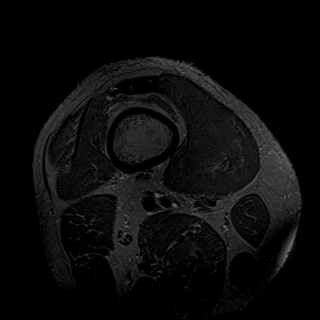

[Series 7: T2 fat-sat · coronal · right · 4.0mm · 0.42mm/px · 6 of 33 slices shown (2 of 3)]
[im 1/33]
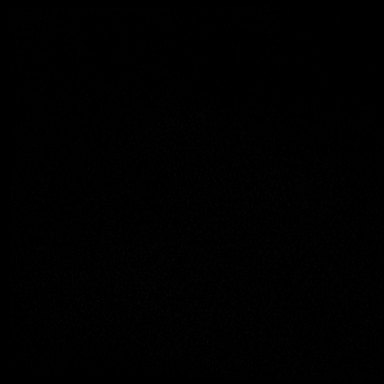
[im 7/33]
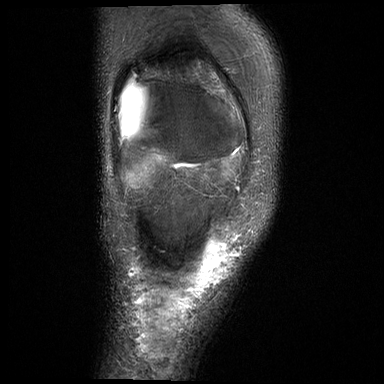
[im 13/33]
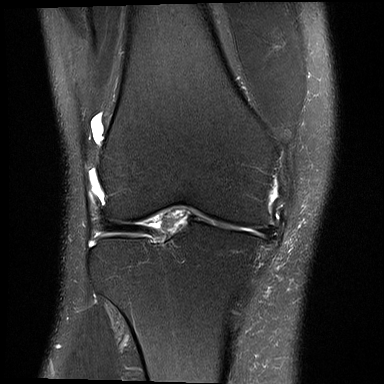
[im 20/33]
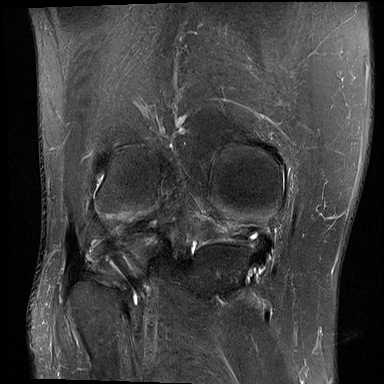
[im 26/33]
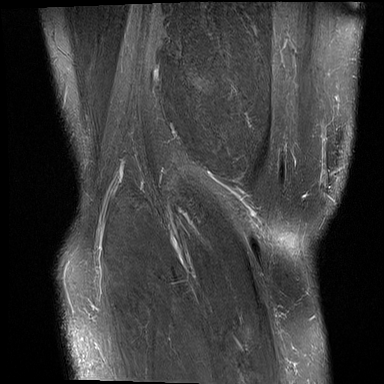
[im 33/33]
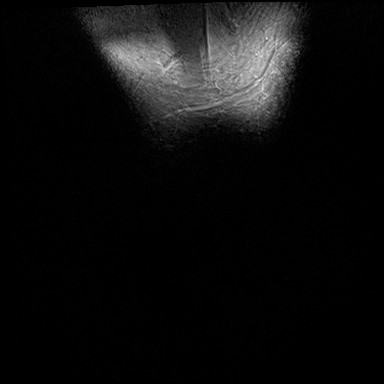

[Series 8: T1 · coronal · right · 4.0mm · 0.42mm/px · 3 of 33 slices shown]
[im 1/33]
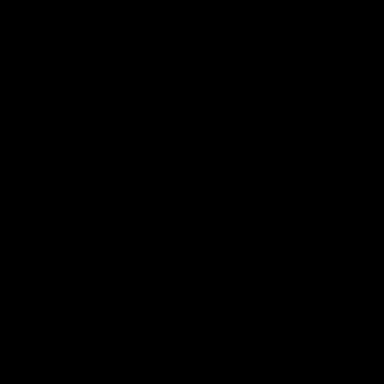
[im 7/33]
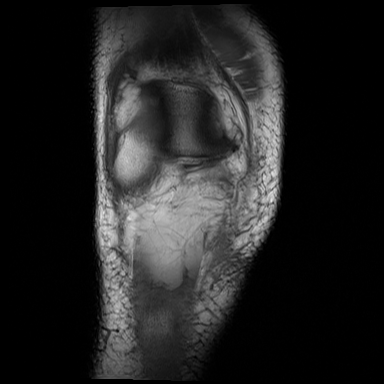
[im 13/33]
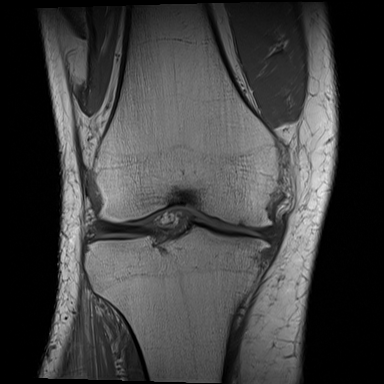

[Series 9: PD fat-sat · coronal · right · 3.0mm · 0.47mm/px · 5 of 28 slices shown (1 of 2)]
[im 1/28]
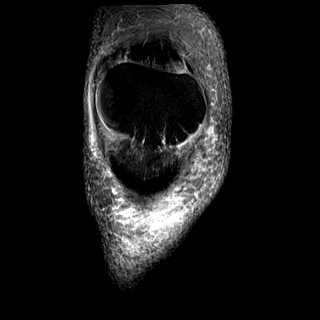
[im 7/28]
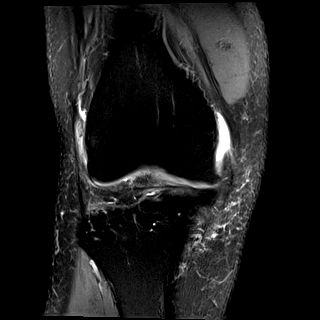
[im 14/28]
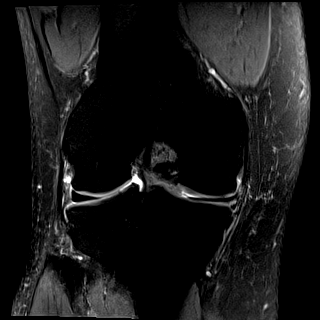
[im 21/28]
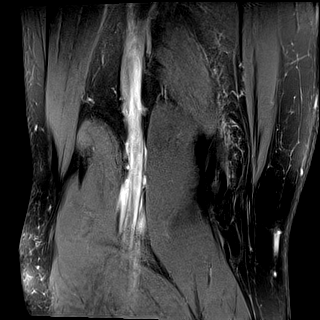
[im 28/28]
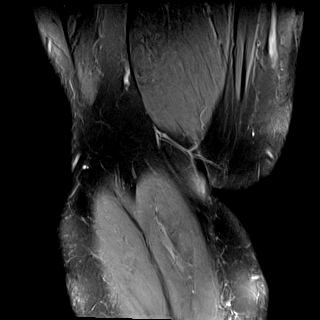

[Series 10: PD fat-sat · sagittal · right · 3.0mm · 0.42mm/px · 6 of 32 slices shown (2 of 2)]
[im 1/32]
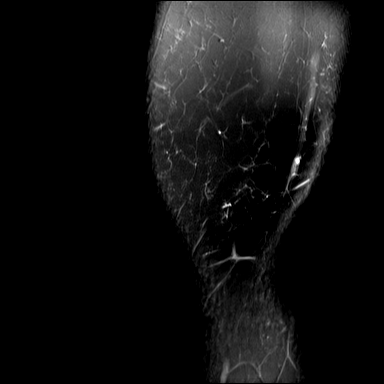
[im 7/32]
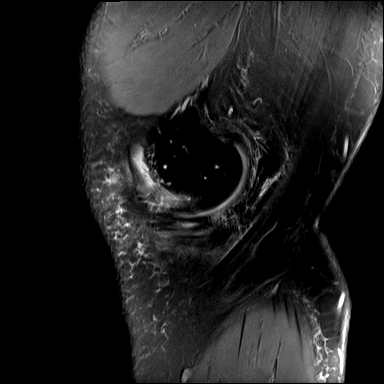
[im 13/32]
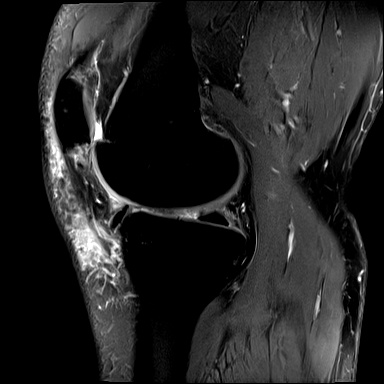
[im 19/32]
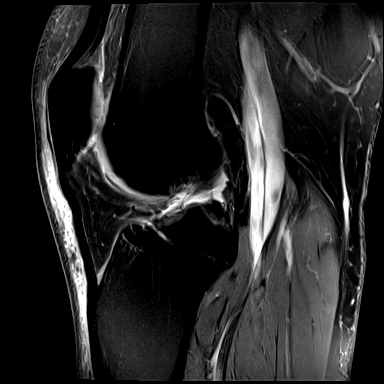
[im 25/32]
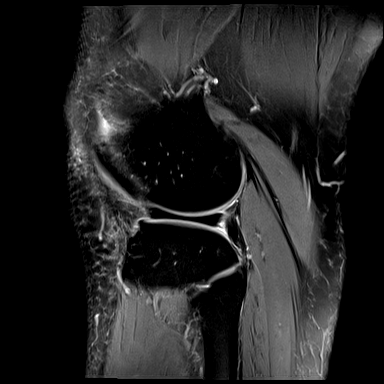
[im 32/32]
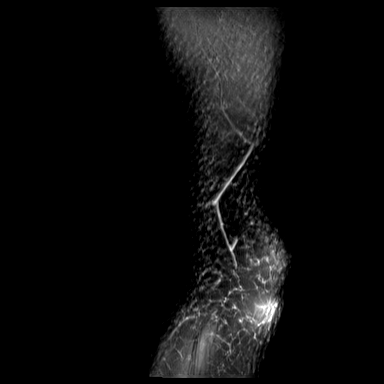

[Series 11: T2 fat-sat · sagittal · right · 3.0mm · 0.42mm/px · 6 of 32 slices shown (3 of 3)]
[im 1/32]
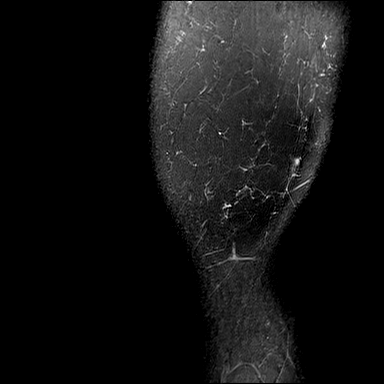
[im 7/32]
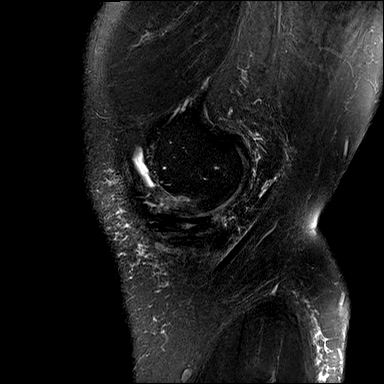
[im 13/32]
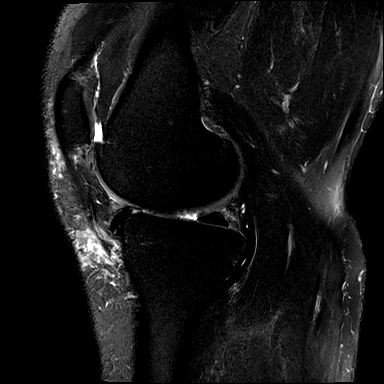
[im 19/32]
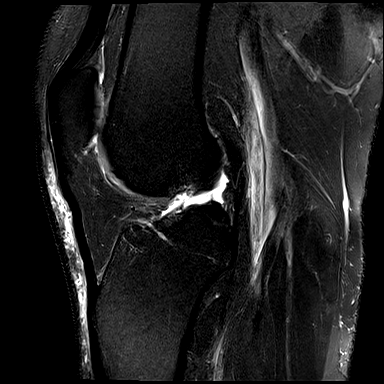
[im 25/32]
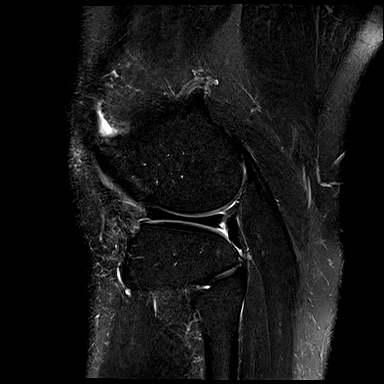
[im 32/32]
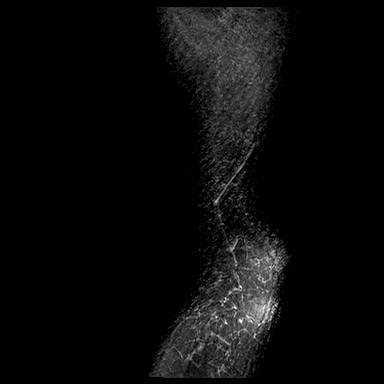

[Series 12: PD · coronal · right · 1.5mm · 0.44mm/px · 4 of 23 slices shown]
[im 1/23]
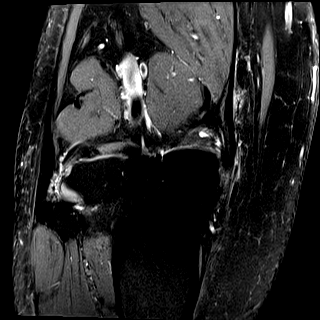
[im 8/23]
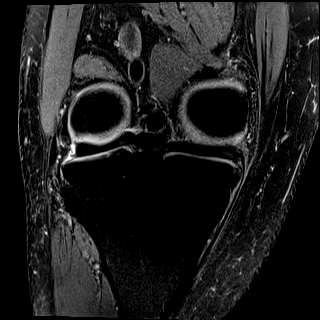
[im 15/23]
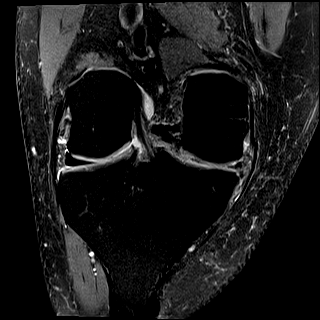
[im 23/23]
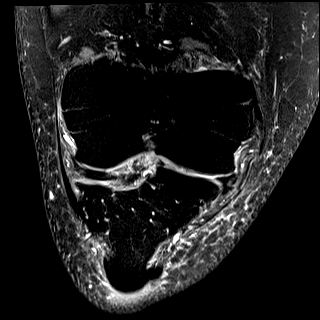

[37 of 40 positions shown; findings below may reference images not displayed]

FINDINGS: MENISCI

Medial meniscus: Grade 3 oblique tear of the posterior horn adjacent
to the midbody potentially extending into the midbody, and involving
the inferior meniscal surface as shown on images 7-8 of series 10
and images 15-16 of series 9.

Lateral meniscus:  Unremarkable

LIGAMENTS

Cruciates:  Unremarkable

Collaterals:  Unremarkable

CARTILAGE

Patellofemoral: Mild degenerative chondral thinning, with chondral
irregularity medially in the femoral trochlear groove.

Medial: Moderate degenerative chondral thinning. Mild marginal
spurring.

Lateral:  Unremarkable

Joint:  Small knee joint effusion.  Thin medial plica.

Popliteal Fossa: Very small Baker's cyst. Small cystic lesion within
the distal semimembranosus tendon example on image [DATE] suspicious
for a small ganglion cyst.

Extensor Mechanism: Prepatellar subcutaneous edema. Mild proximal
patellar tendinopathy.

Bones: No significant extra-articular osseous abnormalities
identified.

Other: No supplemental non-categorized findings.
IMPRESSION: 1. Grade 3 oblique tear of the posterior horn medial meniscus
probably extending into the midbody, involving the inferior surface.
2. Chondral thinning in the medial compartment and in the
patellofemoral joint, with chondral irregularity along the medial
femoral trochlear groove.
3. Small knee effusion with very small Baker's cyst.
4. Small irregular cystic lesion in the distal most semi membranous
is tendon, suspicious for a small ganglion cyst.
5. Mild proximal patellar tendinopathy.

## 2020-12-07 DIAGNOSIS — R109 Unspecified abdominal pain: Secondary | ICD-10-CM | POA: Insufficient documentation

## 2020-12-10 ENCOUNTER — Other Ambulatory Visit: Payer: Self-pay

## 2020-12-25 ENCOUNTER — Ambulatory Visit: Payer: BC Managed Care – PPO | Admitting: Nurse Practitioner

## 2020-12-25 ENCOUNTER — Encounter: Payer: Self-pay | Admitting: Nurse Practitioner

## 2020-12-25 VITALS — BP 148/82 | HR 88 | Ht 65.0 in | Wt 229.1 lb

## 2020-12-25 DIAGNOSIS — Z8719 Personal history of other diseases of the digestive system: Secondary | ICD-10-CM | POA: Diagnosis not present

## 2020-12-25 DIAGNOSIS — Z1211 Encounter for screening for malignant neoplasm of colon: Secondary | ICD-10-CM

## 2020-12-25 MED ORDER — HYDROCORTISONE (PERIANAL) 2.5 % EX CREA: 1.0000 "application " | TOPICAL_CREAM | Freq: Two times a day (BID) | CUTANEOUS | 1 refills | Status: DC

## 2020-12-25 NOTE — Patient Instructions (Signed)
  Si tiene 64 aos o menos, su ndice de [employer] corporal debe estar entre 19 y 35. Su ndice de masa corporal es de 38,13 kg/m. Si esto est fuera del rango mencionado anteriormente, considere hacer un seguimiento con su proveedor de Marine scientist.  PROCEDIMIENTOS:  Zachary Cox han programado una colonoscopia. Siga las instrucciones escritas que se le dieron en su visita de hoy. Recoja sus suministros de preparacin en la farmacia dentro de los prximos 1 a 3 das. Si [place] inhaladores (aunque solo sea necesario), trigalos el da de su procedimiento.  Llame a nuestra oficina si sus sntomas empeoran.  Fue genial verte hoy! Gracias por confiarme su atencin y elegir [facility].  Zachary Cox, CRNP

## 2020-12-25 NOTE — Progress Notes (Signed)
12/25/2020 Zachary Cox [id number] 01-Jan-1971   CHIEF COMPLAINT: Schedule a colonoscopy  HISTORY OF PRESENT ILLNESS: Zachary Cox is a 50 year old male with a past medical history of GERD and diverticulitis 2012.  Past right knee arthroscopy.  He was referred to our office by Lindajo Royal, PA-C for further evaluation regarding GERD symptoms and to schedule a screening colonoscopy.  He speaks Spanish therefore [place] interpreter Alleen Borne was present to facilitate communication throughout today's consult.  He reports having a history of GERD approximately at least 11 years ago.  At that time, he was living in [place] and he underwent an EGD which showed GERD.  He denies having any history of ulcers or H. pylori.  He questions if he has GERD symptoms at this time.  He describes having a rumbling noise in his abdomen with gas production after eating at times.  No specific food triggers.  He denies having any heartburn/acid reflux.  No dysphagia.  No upper abdominal pain.  He took omeprazole in the past which resulted in a rash.  He was recently prescribed Omeprazole a few weeks ago by his PCP resulted in a rash, itchiness and he felt hot so he stopped taking it.  He is passing a normal formed brown bowel movement once daily.  He sees a small amount of bright red blood on the toilet tissue once or twice monthly for week or 2 at a time then go several weeks without seeing any blood.  He has intermittent lower abdominal burning pain, no current lower abdominal pain. He has a history of acute sigmoid diverticulitis 03/01/2011 firm by CTAP.  He also reported having lower abdominal pain while he was in [place] 09/2020 and he presented to the ED. He was diagnosed with an "abdominal infection"  for which he was prescribed an antibiotic.  He denies ever having a screening colonoscopy.  No known family history of esophageal, gastric or colorectal cancer.  His current medication list includes  Diclofenac 75 mg p.o. twice daily and Famotidine 20 mg p.o. twice daily.  He stated he is taking only 1 of these medications and is not sure which one he is actually taking.  Laboratory studies 04/25/2020: WBC 6.2.  Hemoglobin 14.8.  Hematocrit 44.6.  Platelet 236.  Albumin 4.2.  Total bili 0.2.  Alk phos 75.  AST 22.  ALT 27.  Hepatitis C antibody less than 0.1.  TSH 1.65.  Past Medical History:  Diagnosis Date  . Diverticulitis   . Diverticulosis    Past Surgical History:  Procedure Laterality Date  . KNEE ARTHROSCOPY WITH MEDIAL MENISECTOMY Right 12/12/2018   Procedure: RIGHT KNEE ARTHROSCOPY WITH PARTIAL MEDIAL MENISCECTOMY;  Surgeon: Leandrew Koyanagi, MD;  Location: [facility];  Service: Orthopedics;  Laterality: Right;   Social History: He is married.  He has 2 sons and 2 daughters.  Nonsmoker. One two alcoholic beverages monthly. Remote marijuana and cocaine age 21 or 89, none since then.   Family History: Mother with Parkinson's disease. Grandparents died from old age. He has 4 brothers one with diabetes and 3 sisters. No family history of esophageal, gastric or colon cancer. MGF had digestive issus and stomach ulcers.    Allergies  Allergen Reactions  . Blue Dyes (Parenteral)   . Red Dye   . Omeprazole Rash  . Voltaren [Diclofenac] Rash      Outpatient Encounter Medications as of 12/25/2020  Medication Sig  . diclofenac (VOLTAREN) 75 MG EC  tablet Take 75 mg by mouth 2 (two) times daily.  . famotidine (PEPCID) 20 MG tablet famotidine 20 mg tablet  Take 1 tablet twice a day by oral route.  . [DISCONTINUED] clotrimazole-betamethasone (LOTRISONE) cream clotrimazole-betamethasone 1 %-0.05 % topical cream  . [DISCONTINUED] cyclobenzaprine (FLEXERIL) 10 MG tablet cyclobenzaprine 10 mg tablet  Take 1 tablet every day by oral route at bedtime.  . [DISCONTINUED] omeprazole (PRILOSEC) 20 MG capsule Take 20 mg by mouth daily.  . [DISCONTINUED] ondansetron (ZOFRAN) 4 MG  tablet Take 1-2 tablets (4-8 mg total) by mouth every 8 (eight) hours as needed for nausea or vomiting.   No facility-administered encounter medications on file as of 12/25/2020.   REVIEW OF SYSTEMS: Gen: Denies fever, sweats or chills. No weight loss.  CV: Denies chest pain, palpitations or edema. Resp: Denies cough, shortness of breath of hemoptysis.  GI: See HPI. GU : Denies urinary burning, blood in urine, increased urinary frequency or incontinence. MS: Denies joint pain, muscles aches or weakness. Derm: Denies rash, itchiness, skin lesions or unhealing ulcers. Psych: Denies depression, anxiety or memory loss. Heme: Denies bruising, bleeding. Neuro:  Denies headaches, dizziness or paresthesias. Endo:  Denies any problems with DM, thyroid or adrenal function.  PHYSICAL EXAM: BP (!) 148/82 (BP Location: Left Arm, Patient Position: Sitting, Cuff Size: Normal)   Pulse 88   Ht '5\' 5"'  (1.651 m) Comment: height measured without shoes  Wt 229 lb 2 oz (103.9 kg)   BMI 38.13 kg/m    General: Well developed 50 year old male in no acute distress. Head: Normocephalic and atraumatic. Eyes:  Sclerae non-icteric, conjunctive pink. Ears: Normal auditory acuity. Mouth: Dentition intact. No ulcers or lesions.  Neck: Supple, no lymphadenopathy or thyromegaly.  Lungs: Clear bilaterally to auscultation without wheezes, crackles or rhonchi. Heart: Regular rate and rhythm. No murmur, rub or gallop appreciated.  Abdomen: Soft, nontender, non distended. No masses. No hepatosplenomegaly. Normoactive bowel sounds x 4 quadrants.  Rectal: Deferred. Musculoskeletal: Symmetrical with no gross deformities. Skin: Warm and dry. No rash or lesions on visible extremities. Extremities: No edema. Neurological: Alert oriented x 4, no focal deficits.  Psychological:  Alert and cooperative. Normal mood and affect.  ASSESSMENT AND PLAN:  32.  50 year old male with a history of diverticulitis in 2012 confirmed by  CTAP and possibly had a 2nd episode 09/2020 with lower abdominal pain treated with an antibiotic per the ED physician in [place].  -Colonoscopy benefits and risks discussed including risk with sedation, risk of bleeding, perforation and infection  -Patient to call our office if lower abdominal pain recur  2. Intermittent rectal bleeding  -See plan in # 1 -Apply a small amount of Desitin inside the anal opening and to the external anal area tid as needed for anal or hemorrhoidal irritation/bleeding.   3. Prior history of GERD. Patient denies having nausea/heartburn or upper abdominal pain. He complains of "stomach rumbling noise". He developed a rash/itchiness on Omeprazole.  -It is not clear to me at this point that the patient has GERD symptoms therefore I have not ordered an EGD at the time of his colonoscopy.  Defer final decision regarding EGD to Dr. Havery Moros.   CC:  Concha Pyo, PA

## 2020-12-27 NOTE — Progress Notes (Signed)
Agree with assessment and plan as outlined.  

## 2020-12-28 NOTE — Progress Notes (Signed)
Reviewed and agree with management plans. ? ?Sharif Rendell L. Analaura Messler, MD, MPH  ?

## 2021-02-17 ENCOUNTER — Encounter: Payer: BC Managed Care – PPO | Admitting: Gastroenterology

## 2021-06-09 ENCOUNTER — Telehealth: Payer: Self-pay | Admitting: Gastroenterology

## 2021-06-09 NOTE — Telephone Encounter (Signed)
Hi Dr. Orvan Falconer, this pt was scheduled for colon with you back in May but had to cancel. He saw [facility] prior to scheduling colon. He wants to r/s that procedure. Does he need to see you in the office first or is it ok to r/s procedure? Thank you.

## 2021-06-09 NOTE — Telephone Encounter (Signed)
May reschedule colonoscopy unless he is having ongoing GI symptoms beyond rectal bleeding. For ongoing symptoms, needs an office visit first. Thanks.

## 2021-06-09 NOTE — Telephone Encounter (Signed)
Ov scheduled on 07/08/21 at 11:00am with APP. Pt still dealing with worsening abd pain and nausea.

## 2021-07-08 ENCOUNTER — Encounter: Payer: Self-pay | Admitting: Nurse Practitioner

## 2021-07-08 ENCOUNTER — Other Ambulatory Visit (INDEPENDENT_AMBULATORY_CARE_PROVIDER_SITE_OTHER): Payer: BC Managed Care – PPO

## 2021-07-08 ENCOUNTER — Ambulatory Visit: Payer: BC Managed Care – PPO | Admitting: Nurse Practitioner

## 2021-07-08 VITALS — BP 130/78 | HR 56 | Ht 65.0 in | Wt 211.0 lb

## 2021-07-08 DIAGNOSIS — L290 Pruritus ani: Secondary | ICD-10-CM | POA: Diagnosis not present

## 2021-07-08 DIAGNOSIS — R1030 Lower abdominal pain, unspecified: Secondary | ICD-10-CM

## 2021-07-08 DIAGNOSIS — K6289 Other specified diseases of anus and rectum: Secondary | ICD-10-CM

## 2021-07-08 LAB — COMPREHENSIVE METABOLIC PANEL
ALT: 20 U/L (ref 0–53)
AST: 20 U/L (ref 0–37)
Albumin: 4.5 g/dL (ref 3.5–5.2)
Alkaline Phosphatase: 61 U/L (ref 39–117)
BUN: 12 mg/dL (ref 6–23)
CO2: 23 mEq/L (ref 19–32)
Calcium: 10.1 mg/dL (ref 8.4–10.5)
Chloride: 105 mEq/L (ref 96–112)
Creatinine, Ser: 0.82 mg/dL (ref 0.40–1.50)
GFR: 101.46 mL/min (ref >60.00)
Glucose, Bld: 109 mg/dL — ABNORMAL HIGH (ref 70–99)
Potassium: 4.5 mEq/L (ref 3.5–5.1)
Sodium: 139 mEq/L (ref 135–145)
Total Bilirubin: 1.2 mg/dL (ref 0.2–1.2)
Total Protein: 7.5 g/dL (ref 6.0–8.3)

## 2021-07-08 LAB — CBC WITH DIFFERENTIAL/PLATELET
Basophils Absolute: 0.1 10*3/uL (ref 0.0–0.1)
Basophils Relative: 0.8 % (ref 0.0–3.0)
Eosinophils Absolute: 0.2 10*3/uL (ref 0.0–0.7)
Eosinophils Relative: 2 % (ref 0.0–5.0)
HCT: 44.7 % (ref 39.0–52.0)
Hemoglobin: 14.6 g/dL (ref 13.0–17.0)
Lymphocytes Relative: 25.7 % (ref 12.0–46.0)
Lymphs Abs: 1.9 10*3/uL (ref 0.7–4.0)
MCHC: 32.7 g/dL (ref 30.0–36.0)
MCV: 92.3 fl (ref 78.0–100.0)
Monocytes Absolute: 0.6 10*3/uL (ref 0.1–1.0)
Monocytes Relative: 7.3 % (ref 3.0–12.0)
Neutro Abs: 4.8 10*3/uL (ref 1.4–7.7)
Neutrophils Relative %: 64.2 % (ref 43.0–77.0)
Platelets: 247 10*3/uL (ref 150.0–400.0)
RBC: 4.84 Mil/uL (ref 4.22–5.81)
RDW: 14.1 % (ref 11.5–15.5)
WBC: 7.6 10*3/uL (ref 4.0–10.5)

## 2021-07-08 NOTE — Progress Notes (Signed)
     07/08/2021 Zachary Cox [id number] March 24, 1971   Chief Complaint:  Schedule a colonoscopy   History of Present Illness: Zachary Cox is a 50 year old male with a past medical history of GERD and diverticulitis 2012.  Past right knee arthroscopy. I saw the patient in the office on 12/25/2020 to schedule a colonoscopy, refer to this office visit note for comprehensive history review.  At that time, he reported experiencing an rumbling noise in his abdomen after eating without any associated abdominal pain, heartburn or dysphagia. A colonoscopy scheduled in May with Dr. Orvan Falconer but he canceled this procedure. He called out office on 06/09/2021 with complaints of nausea and lower abdominal pain with rectal swelling.  He was advised to schedule a follow up appointment for further evaluation prior to pursuing a colonoscopy.  He presents to our office today accompanied by a [place] Spanish interpreter, he speaks limited [place].  He denies having any further nausea, lower abdominal pain for the past week.  He has intermittent anal itchiness without recent bleeding.  He uses baby wipes after he passes a BM.  He denies having any dysphagia, heartburn or upper abdominal pain.  No other complaints at this time.  No current outpatient medications on file prior to visit.   No current facility-administered medications on file prior to visit.   Allergies  Allergen Reactions   Blue Dyes (Parenteral)    Red Dye    Omeprazole Rash   Voltaren [Diclofenac] Rash   Current Medications, Allergies, Past Medical History, Past Surgical History, Family History and Social History were reviewed in [employer] record.   Review of Systems:   Constitutional: Negative for fever, sweats, chills or weight loss.  Respiratory: Negative for shortness of breath.   Cardiovascular: Negative for chest pain, palpitations and leg swelling.  Gastrointestinal: See HPI.  Musculoskeletal: Negative for  back pain or muscle aches.  Neurological: Negative for dizziness, headaches or paresthesias.    Physical Exam: BP 130/78   Pulse (!) 56   Ht 5\' 5"  (1.651 m)   Wt 211 lb (95.7 kg)   BMI 35.11 kg/m  General: 50 year old male in no acute distress. Head: Normocephalic and atraumatic. Eyes: No scleral icterus. Conjunctiva pink . Ears: Normal auditory acuity. Mouth: Dentition intact. No ulcers or lesions.  Lungs: Clear throughout to auscultation. Heart: Regular rate and rhythm, no murmur. Abdomen: Soft, nontender and nondistended. No masses or hepatomegaly. Normal bowel sounds x 4 quadrants.  Rectal: Deferred.  Musculoskeletal: Symmetrical with no gross deformities. Extremities: No edema. Neurological: Alert oriented x 4. No focal deficits.  Psychological: Alert and cooperative. Normal mood and affect  Assessment and Recommendations:  33) 50 year old male with history of diverticulitis confirmed by CTAP in 2012, possibly had a second episode 09/2020 with intermittent lower abdominal pain and rectal swelling -Colonoscopy benefits and risks discussed including risk with sedation, risk of bleeding, perforation and infection  -Preparation H apply small amount to the anal area 3 times daily as needed -CBC and CMP -Further recommendations to be determined after the above evaluation completed

## 2021-07-08 NOTE — Patient Instructions (Signed)
PROCEDIMIENTOS: Conley Rolls han programado una colonoscopia. Siga las instrucciones escritas que se le dieron en su visita de hoy. Si [place] inhaladores (aunque solo sea necesario), trigalos el da de su procedimiento.  LABORATORIOS: El trabajo de laboratorio ha sido ordenado para usted hoy. Nuestro laboratorio est [employer] stano. Presione "B" en el ascensor. El laboratorio est ubicado en la primera puerta a la izquierda al salir del Medical sales representative.  RECOMENDACIONES: Use la Preparacin H, aplique una pequea cantidad en el rea anal tres veces al da segn sea necesario. Llame a nuestra oficina si sus sntomas empeoran.  Fue genial verte hoy! Gracias por confiarme su atencin y elegir [facility].  Arnaldo Natal, CRNP  Si tiene 65 aos o ms, su ndice de masa corporal debe estar entre 23 y 30. Tu ndice de masa corporal es de 35,11 kg/m. Si esto est fuera del rango mencionado anteriormente, considere hacer un seguimiento con su proveedor de Marine scientist.  Si tiene 64 aos o menos, su ndice de [employer] corporal debe estar entre 19 y 71. Tu ndice de masa corporal es de 35,11 kg/m. Si esto est fuera del rango mencionado anteriormente, considere hacer un seguimiento con su proveedor de Marine scientist.  Los proveedores de Adult nurse GI desean alentarlo a que use MYCHART para comunicarse con los proveedores para solicitudes o preguntas que no sean urgentes. Debido a los Retail buyer de espera en el telfono, enviar un mensaje a su proveedor por [employer] puede ser una forma ms rpida y eficiente de obtener una respuesta. Espere 48 horas hbiles para obtener [employer]. Recuerde que esto es para solicitudes/preguntas no urgentes.  PROCEDURES: You have been scheduled for a colonoscopy. Please follow the written instructions given to you at your visit today. If you use inhalers (even only as needed), please bring them with you on the day of your procedure.  LABS:  Lab  work has been ordered for you today. Our lab is located in the basement. Press "B" on the elevator. The lab is located at the first door on the left as you exit the elevator.  RECOMMENDATIONS: Use Preparation H, apply a small amount to the anal area three times a day as needed. Please call our office if your symptoms worsen.  It was great seeing you today! Thank you for entrusting me with your care and choosing [facility].  Arnaldo Natal, CRNP  If you are age 52 or older, your body mass index should be between 23-30. Your Body mass index is 35.11 kg/m. If this is out of the aforementioned range listed, please consider follow up with your Primary Care Provider.  If you are age 18 or younger, your body mass index should be between 19-25. Your Body mass index is 35.11 kg/m. If this is out of the aformentioned range listed, please consider follow up with your Primary Care Provider.   The [facility] providers would like to encourage you to use [facility] to communicate with providers for non-urgent requests or questions.  Due to long hold times on the telephone, sending your provider a message by [facility] may be faster and more efficient way to get a response. Please allow 48 business hours for a response.  Please remember that this is for non-urgent requests/questions.

## 2021-07-09 NOTE — Progress Notes (Signed)
Patient notified

## 2021-07-12 NOTE — Progress Notes (Signed)
Reviewed and agree with management plans. ? ?Zamariyah Furukawa L. Jeannifer Drakeford, MD, MPH  ?

## 2021-07-14 ENCOUNTER — Ambulatory Visit (AMBULATORY_SURGERY_CENTER): Payer: BC Managed Care – PPO | Admitting: Gastroenterology

## 2021-07-14 ENCOUNTER — Encounter: Payer: Self-pay | Admitting: Gastroenterology

## 2021-07-14 ENCOUNTER — Other Ambulatory Visit: Payer: Self-pay

## 2021-07-14 VITALS — BP 110/71 | HR 62 | Temp 98.0°F | Resp 9 | Ht 65.0 in | Wt 211.0 lb

## 2021-07-14 DIAGNOSIS — K648 Other hemorrhoids: Secondary | ICD-10-CM | POA: Diagnosis not present

## 2021-07-14 DIAGNOSIS — K6289 Other specified diseases of anus and rectum: Secondary | ICD-10-CM | POA: Diagnosis not present

## 2021-07-14 DIAGNOSIS — Z1211 Encounter for screening for malignant neoplasm of colon: Secondary | ICD-10-CM | POA: Diagnosis not present

## 2021-07-14 DIAGNOSIS — K5730 Diverticulosis of large intestine without perforation or abscess without bleeding: Secondary | ICD-10-CM | POA: Diagnosis not present

## 2021-07-14 DIAGNOSIS — R1084 Generalized abdominal pain: Secondary | ICD-10-CM

## 2021-07-14 DIAGNOSIS — Z8719 Personal history of other diseases of the digestive system: Secondary | ICD-10-CM

## 2021-07-14 DIAGNOSIS — R1030 Lower abdominal pain, unspecified: Secondary | ICD-10-CM

## 2021-07-14 DIAGNOSIS — L290 Pruritus ani: Secondary | ICD-10-CM

## 2021-07-14 MED ORDER — SODIUM CHLORIDE 0.9 % IV SOLN: 500.0000 mL | INTRAVENOUS | Status: AC

## 2021-07-14 MED ORDER — HYDROCORTISONE (PERIANAL) 2.5 % EX CREA: 1.0000 "application " | TOPICAL_CREAM | Freq: Two times a day (BID) | CUTANEOUS | 1 refills | Status: AC

## 2021-07-14 NOTE — Progress Notes (Signed)
Called to room to assist during endoscopic procedure.  Patient ID and intended procedure confirmed with present staff. Received instructions for my participation in the procedure from the performing physician.  

## 2021-07-14 NOTE — Progress Notes (Signed)
Interpreter used today at the [facility] for this pt.  Interpreter's name is- Alis 

## 2021-07-14 NOTE — Patient Instructions (Addendum)
YOU HAD AN ENDOSCOPIC PROCEDURE TODAY AT THE [facility]:   Refer to the procedure report that was given to you for any specific questions about what was found during the examination.  If the procedure report does not answer your questions, please call your gastroenterologist to clarify.  If you requested that your care partner not be given the details of your procedure findings, then the procedure report has been included in a sealed envelope for you to review at your convenience later.  YOU SHOULD EXPECT: Some feelings of bloating in the abdomen. Passage of more gas than usual.  Walking can help get rid of the air that was put into your GI tract during the procedure and reduce the bloating. If you had a lower endoscopy (such as a colonoscopy or flexible sigmoidoscopy) you may notice spotting of blood in your stool or on the toilet paper. If you underwent a bowel prep for your procedure, you may not have a normal bowel movement for a few days.  Please Note:  You might notice some irritation and congestion in your nose or some drainage.  This is from the oxygen used during your procedure.  There is no need for concern and it should clear up in a day or so.  SYMPTOMS TO REPORT IMMEDIATELY:  Following lower endoscopy (colonoscopy or flexible sigmoidoscopy):  Excessive amounts of blood in the stool  Significant tenderness or worsening of abdominal pains  Swelling of the abdomen that is new, acute  Fever of 100F or higher   For urgent or emergent issues, a gastroenterologist can be reached at any hour by calling [phone]. Do not use MyChart messaging for urgent concerns.    DIET:  We do recommend a small meal at first, but then you may proceed to your regular diet.  Drink plenty of fluids but you should avoid alcoholic beverages for 24 hours. Follow a High Fiber Diet.  MEDICATIONS: Continue present medications. Add a daily stool bulking agent such as psyllium or methylcellulose.  Start using Anusol HC 2.5% applied sparingly to your rectum twice daily. Sitz baths may provide some additional relief of discomfort.  Please see handouts given to you by your recovery nurse.  Thank you for allowing [place] to provide for your healthcare needs today.  ACTIVITY:  You should plan to take it easy for the rest of today and you should NOT DRIVE or use heavy machinery until tomorrow (because of the sedation medicines used during the test).    FOLLOW UP: Our staff will call the number listed on your records 48-72 hours following your procedure to check on you and address any questions or concerns that you may have regarding the information given to you following your procedure. If we do not reach you, we will leave a message.  We will attempt to reach you two times.  During this call, we will ask if you have developed any symptoms of COVID 19. If you develop any symptoms (ie: fever, flu-like symptoms, shortness of breath, cough etc.) before then, please call [phone].  If you test positive for Covid 19 in the 2 weeks post procedure, please call and report this information to [place].    If any biopsies were taken you will be contacted by phone or by letter within the next 1-3 weeks.  Please call us at [phone] if you have not heard about the biopsies in 3 weeks.    SIGNATURES/CONFIDENTIALITY: You and/or your care partner have signed paperwork which  will be entered into your electronic medical record.  These signatures attest to the fact that that the information above on your After Visit Summary has been reviewed and is understood.  Full responsibility of the confidentiality of this discharge information lies with you and/or your care-partner. USTED TUVO UN PROCEDIMIENTO ENDOSCPICO HOY EN EL [facility]:   Lea el informe del procedimiento que se le entreg para cualquier pregunta especfica sobre lo que se Dentist.  Si el informe del examen no responde a sus  preguntas, por favor llame a su gastroenterlogo para aclararlo.  Si usted solicit que no se le den [employer] de lo que se Clinical cytogeneticist en su procedimiento al [employer] va a cuidar, entonces el informe del procedimiento se ha incluido en un sobre sellado para que usted lo revise despus cuando le sea ms conveniente.   LO QUE PUEDE ESPERAR: Algunas sensaciones de hinchazn en el abdomen.  Puede tener ms gases de lo normal.  El caminar puede ayudarle a eliminar el aire que se le puso en el tracto gastrointestinal durante el procedimiento y reducir la hinchazn.  Si le hicieron una endoscopia inferior (como una colonoscopia o una sigmoidoscopia flexible), podra notar manchas de sangre en las heces fecales o en el papel higinico.  Si se someti a una preparacin intestinal para su procedimiento, es posible que no tenga una evacuacin intestinal normal durante [employer].   Tenga en cuenta:  Es posible que note un poco de irritacin y congestin en la nariz o algn drenaje.  Esto es debido al oxgeno [employer] durante su procedimiento.  No hay que preocuparse y esto debe desaparecer ms o Regulatory affairs officer.   SNTOMAS PARA REPORTAR INMEDIATAMENTE:  Despus de una endoscopia inferior (colonoscopia o sigmoidoscopia flexible):  Cantidades excesivas de sangre en las heces fecales  Sensibilidad significativa o empeoramiento de los dolores abdominales   Hinchazn aguda del abdomen que antes no tena   Fiebre de 100F o ms   Despus de la endoscopia superior (EGD)  Vmitos de Retail buyer o material como caf molido   Dolor en el pecho o dolor debajo de los omplatos que antes no tena   Dolor o dificultad persistente para tragar  Falta de aire que antes no tena   Fiebre de 100F o ms  Heces fecales negras y pegajosas   Para asuntos urgentes o de Associate Professor, puede comunicarse con un gastroenterlogo a cualquier hora llamando al [phone].  DIETA:  Recomendamos una comida pequea al principio,  pero luego puede continuar con su dieta normal.  Tome muchos lquidos, Tax adviser las bebidas alcohlicas durante 24 horas.    ACTIVIDAD:  Debe planear tomarse las cosas con calma por el resto del da y no debe CONDUCIR ni usar maquinaria pesada Patent examiner (debido a los medicamentos de sedacin utilizados durante el examen).     SEGUIMIENTO: Nuestro personal llamar al nmero que aparece en su historial al siguiente da hbil de su procedimiento para ver cmo se siente y para responder cualquier pregunta o inquietud que pueda tener con respecto a la informacin que se le dio despus del procedimiento. Si no podemos contactarle, le dejaremos un mensaje.  Sin embargo, si se siente bien y no tiene English as a second language teacher, no es necesario que nos devuelva la llamada.  Asumiremos que ha regresado a sus actividades diarias normales sin incidentes. Si se le tomaron algunas biopsias, le contactaremos por telfono o por carta en las prximas 3 semanas.  Si no ha sabido [employer] biopsias en el transcurso de 3 semanas, por favor llmenos al [phone].   FIRMAS/CONFIDENCIALIDAD: Usted y/o el acompaante que le cuide han firmado documentos que se ingresarn en su historial mdico Forensic scientist.  Estas firmas atestiguan el hecho de que la informacin anterior  Cmo tomar un bao de asiento How to Take a [employer] Un bao de asiento es un bao de agua tibia que se puede usar para cuidar el recto, la zona genital o la zona entre el recto y los genitales (perineo). En un bao de asiento, el agua solamente llega [employer] caderas y [place] las nalgas. Un bao de asiento puede hacerse en la baera o en una tina porttil para bao de asiento que se coloca sobre el inodoro. Su mdico puede recomendar un bao de asiento para ayudarlo con lo siguiente: Engineer, materials y las molestias despus de dar a Patent examiner. Aliviar el dolor y la picazn causados por las hemorroides o las fisuras anales. Aliviar el dolor despus de  determinadas cirugas. Relajar los msculos doloridos o tensos. Cmo tomar un bao de [employer] 3 o 4 baos de asiento diarios o tantos como se lo haya indicado el mdico. Bao de asiento en la baera Para tomar un bao de asiento en una baera: Llene parte de la baera con agua tibia. El agua debe tener la profundidad suficiente para cubrirle las caderas y las nalgas cuando est sentado en la baera. Siga las instrucciones de su mdico si le indica que ponga medicamentos en el agua. Sintese en el agua. Abra un poco el drenaje de la baera y djelo abierto durante su bao. Abra el agua tibia nuevamente, lo suficiente para reponer Firefighter. Deje correr el agua durante todo su bao. Esto ayuda a Surveyor, minerals en el nivel adecuado y Retail buyer. Sumrjase en el agua entre 15 y 20 minutos, o el tiempo que le haya indicado el mdico. Cuando termine, tenga cuidado al ponerse de pie. Puede sentirse mareado. Luego del bao de asiento, squese con golpecitos suaves. No frote la piel para secarla.  Bao de asiento sobre el inodoro Para tomar un bao de asiento con un recipiente sobre el inodoro: Siga las instrucciones del fabricante. Llene el recipiente con agua tibia. Siga las instrucciones de su mdico si le indic que ponga medicamentos en el agua. Sintese en el asiento. Asegrese de que el agua le cubra las nalgas y el perineo. Sumrjase en el agua entre 15 y 20 minutos, o el tiempo que le haya indicado el mdico. Luego del bao de asiento, squese con golpecitos suaves. No frote la piel para secarla. Limpie y seque la tina despus de cada uso. Deseche el recipiente si se agrieta o segn las instrucciones del fabricante.  Comunquese con un mdico si: El dolor o la picazn [place]. No contine con los baos de asiento si los sntomas empeoran. Aparecen nuevos sntomas. No contine con los baos de asiento hasta que hable con el mdico. Resumen Un bao de asiento es un  bao con agua tibia en el cual el agua solo le llega hasta la cadera y cubre las nalgas. Un bao de asiento puede [employer] y las molestias despus de dar a Patent examiner. Tambin puede ayudar con Chief Technology Officer y la picazn de las hemorroides o fisuras anales, o con el dolor despus de ciertas cirugas. Tambin puede ayudar a [employer] doloridos o tensos. Tome 3 o 4 baos de  asiento diarios o tantos como se lo haya indicado el mdico. Sumrjase en el agua entre 15 y 20 minutos. No contine con los baos de asiento si los sntomas empeoran. Esta informacin no tiene Theme park manager el consejo del mdico. Asegrese de hacerle al mdico cualquier pregunta que tenga. Document Revised: 08/06/2020 Document Reviewed: 07/10/2020 Elsevier Patient Education  2022 [employer].

## 2021-07-14 NOTE — Progress Notes (Signed)
Interpreter used today at the [facility] for this pt.  Interpreter's name is-Alis Herrera 

## 2021-07-14 NOTE — Progress Notes (Signed)
Report given to PACU, vss 

## 2021-07-14 NOTE — Progress Notes (Addendum)
   Referring Provider: Kurtis Bushman, PA Primary Care Physician:  Kurtis Bushman, PA  Reason for Procedure:  Rectal pain   IMPRESSION:  Colonoscopy to evaluate abdominal pain and rectal swelling and pain Appropriate candidate for monitored anesthesia care  PLAN: Colonoscopy in the [facility] today   HPI: Zachary Cox is a 51 y.o. male with a history of diverticulitis who presents for colonoscopy to evaluate abdominal pain and rectal swelling and pain. See Annabell Howells office note from 07/08/21 for full details. No change in history or PE.    Past Medical History:  Diagnosis Date   Diverticulitis    Diverticulosis     Past Surgical History:  Procedure Laterality Date   KNEE ARTHROSCOPY WITH MEDIAL MENISECTOMY Right 12/12/2018   Procedure: RIGHT KNEE ARTHROSCOPY WITH PARTIAL MEDIAL MENISCECTOMY;  Surgeon: Tarry Kos, MD;  Location: [facility];  Service: Orthopedics;  Laterality: Right;    No current outpatient medications on file.   Current Facility-Administered Medications  Medication Dose Route Frequency Provider Last Rate Last Admin   0.9 %  sodium chloride infusion  500 mL Intravenous Continuous Tressia Danas, MD        Allergies as of 07/14/2021 - Review Complete 07/14/2021  Allergen Reaction Noted   Blue dyes (parenteral)  12/06/2018   Red dye  12/06/2018   Omeprazole Rash 12/25/2020   Voltaren [diclofenac] Rash 12/06/2018    Family History  Problem Relation Age of Onset   Parkinson's disease Mother    Diabetes Brother    Colon cancer Neg Hx    Colon polyps Neg Hx    Esophageal cancer Neg Hx    Rectal cancer Neg Hx    Stomach cancer Neg Hx      Physical Exam: General:   Alert,  well-nourished, pleasant and cooperative in NAD Head:  Normocephalic and atraumatic. Eyes:  Sclera clear, no icterus.   Conjunctiva pink. Mouth:  No deformity or lesions.   Neck:  Supple; no masses or thyromegaly. Lungs:  Clear throughout to  auscultation.   No wheezes. Heart:  Regular rate and rhythm; no murmurs. Abdomen:  Soft, non-tender, nondistended, normal bowel sounds, no rebound or guarding.  Msk:  Symmetrical. No boney deformities LAD: No inguinal or umbilical LAD Extremities:  No clubbing or edema. Neurologic:  Alert and  oriented x4;  grossly nonfocal Skin:  No obvious rash or bruise. Psych:  Alert and cooperative. Normal mood and affect.    Ani Deoliveira L. Orvan Falconer, MD, MPH 07/14/2021, 1:27 PM

## 2021-07-14 NOTE — Op Note (Signed)
[facility] Patient Name: Zachary Cox Procedure Date: 07/14/2021 1:31 PM MRN: [id number] Endoscopist: Tressia Danas MD, MD Age: 50 Referring MD:  Date of Birth: 10/13/1971 Gender: Male Account #: [id number] Procedure:                Colonoscopy Indications:              Abdominal pain, Rectal pain and fullness                           History of diverticulitis Medicines:                Monitored Anesthesia Care Procedure:                Pre-Anesthesia Assessment:                           - Prior to the procedure, a History and Physical                            was performed, and patient medications and                            allergies were reviewed. The patient's tolerance of                            previous anesthesia was also reviewed. The risks                            and benefits of the procedure and the sedation                            options and risks were discussed with the patient.                            All questions were answered, and informed consent                            was obtained. Prior Anticoagulants: The patient has                            taken no previous anticoagulant or antiplatelet                            agents. ASA Grade Assessment: II - A patient with                            mild systemic disease. After reviewing the risks                            and benefits, the patient was deemed in                            satisfactory condition to undergo the procedure.  After obtaining informed consent, the colonoscope                            was passed under direct vision. Throughout the                            procedure, the patient's blood pressure, pulse, and                            oxygen saturations were monitored continuously. The                            Olympus CF-HQ190L ([id number]) Colonoscope was                            introduced through the anus and advanced to  the 3                            cm into the ileum. A second forward view of the                            right colon was performed. The colonoscopy was                            performed without difficulty. The patient tolerated                            the procedure well. The quality of the bowel                            preparation was good. The terminal ileum, ileocecal                            valve, appendiceal orifice, and rectum were                            photographed. Scope In: 1:36:16 PM Scope Out: 1:48:28 PM Scope Withdrawal Time: 0 hours 10 minutes 13 seconds  Total Procedure Duration: 0 hours 12 minutes 12 seconds  Findings:                 The perianal and digital rectal examinations were                            normal.                           Non-bleeding internal hemorrhoids were found.                           Multiple small and large-mouthed diverticula were                            found in the sigmoid colon and descending colon.  There were a few diverticula in the right colon.                           A patchy area of mildly erythematous mucosa was                            found in the sigmoid colon in the area of the most                            dense diverticulosis. Biopsies were taken with a                            cold forceps for histology. Estimated blood loss                            was minimal.                           The exam was otherwise without abnormality on                            direct and retroflexion views. Complications:            No immediate complications. Estimated blood loss:                            Minimal. Estimated Blood Loss:     Estimated blood loss was minimal. Impression:               - Non-bleeding internal hemorrhoids.                           - Diverticulosis in the sigmoid colon and in the                            descending colon.                           -  Erythematous mucosa in the sigmoid colon.                            Biopsied to exclude segmental colitis of                            diverticulitis.                           - The examination was otherwise normal on direct                            and retroflexion views. Recommendation:           - Patient has a contact number available for                            emergencies. The signs and symptoms of potential  delayed complications were discussed with the                            patient. Return to normal activities tomorrow.                            Written discharge instructions were provided to the                            patient.                           - Resume previous diet. High fiber diet encouraged.                           - Continue present medications.                           - Await pathology results.                           - Repeat colonoscopy in 10 years for surveillance                            for colon cancer screening, earlier if needed.                           - Add a daily stool bulking agent such as psyllium                            or methylcellulose.                           - Start using Anusol HC 2.5% applied sparingly to                            your rectum twice daily.                           - Sitz baths may provide some additional relief.                           - If you would like more information,                            MyGIHealth.com and UpToDate.com have good                            information about hemorrhoids.                           - Emerging evidence supports eating a diet of                            fruits, vegetables, grains, calcium, and yogurt  while reducing red meat and alcohol may reduce the                            risk of colon cancer. Tressia Danas MD, MD 07/14/2021 1:56:34 PM This report has been signed electronically.

## 2021-07-14 NOTE — Progress Notes (Signed)
Pt's states no medical or surgical changes since previsit or office visit. 

## 2021-07-15 ENCOUNTER — Other Ambulatory Visit: Payer: Self-pay

## 2021-07-15 DIAGNOSIS — R1030 Lower abdominal pain, unspecified: Secondary | ICD-10-CM

## 2021-07-15 MED ORDER — PSYLLIUM 0.36 G PO CAPS: ORAL_CAPSULE | ORAL | Status: AC

## 2021-07-16 ENCOUNTER — Telehealth: Payer: Self-pay | Admitting: *Deleted

## 2021-07-16 NOTE — Telephone Encounter (Signed)
  Follow up Call-  Call back number 07/14/2021  Post procedure Call Back phone  # [phone]  Permission to leave phone message Yes  Some recent data might be hidden     Patient questions:  Do you have a fever, pain , or abdominal swelling? No. Pain Score  0 *  Have you tolerated food without any problems? Yes.    Have you been able to return to your normal activities? Yes.    Do you have any questions about your discharge instructions: Diet   No. Medications  No. Follow up visit  No.  Do you have questions or concerns about your Care? No.  Actions: * If pain score is 4 or above: No action needed, pain <4.

## 2021-07-20 ENCOUNTER — Encounter: Payer: Self-pay | Admitting: Gastroenterology

## 2022-06-24 ENCOUNTER — Encounter: Payer: Self-pay | Admitting: Orthopaedic Surgery

## 2022-06-24 ENCOUNTER — Ambulatory Visit: Payer: BC Managed Care – PPO | Admitting: Orthopaedic Surgery

## 2022-06-24 ENCOUNTER — Ambulatory Visit (INDEPENDENT_AMBULATORY_CARE_PROVIDER_SITE_OTHER): Payer: BC Managed Care – PPO

## 2022-06-24 DIAGNOSIS — M25562 Pain in left knee: Secondary | ICD-10-CM

## 2022-06-24 DIAGNOSIS — G8929 Other chronic pain: Secondary | ICD-10-CM | POA: Diagnosis not present

## 2022-06-24 MED ORDER — LIDOCAINE HCL 1 % IJ SOLN
2.0000 mL | INTRAMUSCULAR | Status: AC | PRN
  Administered 2022-06-24: 2 mL

## 2022-06-24 MED ORDER — METHYLPREDNISOLONE ACETATE 40 MG/ML IJ SUSP
40.0000 mg | INTRAMUSCULAR | Status: AC | PRN
  Administered 2022-06-24: 40 mg via INTRA_ARTICULAR

## 2022-06-24 MED ORDER — BUPIVACAINE HCL 0.5 % IJ SOLN
2.0000 mL | INTRAMUSCULAR | Status: AC | PRN
  Administered 2022-06-24: 2 mL via INTRA_ARTICULAR

## 2022-06-24 NOTE — Progress Notes (Signed)
Office Visit Note   Patient: Zachary Cox           Date of Birth: Apr 09, 1971           MRN: [id number] Visit Date: 06/24/2022              Requested by: Kurtis Bushman, PA [address] [place],  [place] [address] PCP: Kurtis Bushman, PA   Assessment & Plan: Visit Diagnoses:  1. Chronic pain of left knee     Plan: Impression is chronic left knee pain likely OA exacerbation.  Does not have significant degenerative changes on x-rays.  Based on treatment options he would like to try cortisone injection today.  He will also pick up some Voltaren gel.  He was provided with a Synvisc pamphlet.  Questions encouraged and answered.  Follow-up as needed.  Follow-Up Instructions: No follow-ups on file.   Orders:  Orders Placed This Encounter  Procedures   XR KNEE 3 VIEW LEFT   No orders of the defined types were placed in this encounter.     Procedures: Large Joint Inj: L knee on 06/24/2022 9:18 AM Details: 22 G needle Medications: 2 mL bupivacaine 0.5 %; 2 mL lidocaine 1 %; 40 mg methylPREDNISolone acetate 40 MG/ML Outcome: tolerated well, no immediate complications Patient was prepped and draped in the usual sterile fashion.       Clinical Data: No additional findings.   Subjective: Chief Complaint  Patient presents with   Left Knee - Pain    HPI Patient is a 51 year old gentleman here for chronic left knee pain for 2 to 3 months.  Denies any injuries.  Feels medial and lateral pain.  Denies any swelling or giving way.  Not taking any medications for this.  Underwent right knee scope in 2020 from which he did well.    Review of Systems  Constitutional: Negative.   All other systems reviewed and are negative.    Objective: Vital Signs: There were no vitals taken for this visit.  Physical Exam Vitals and nursing note reviewed.  Constitutional:      Appearance: He is well-developed.  HENT:     Head: Normocephalic and atraumatic.  Eyes:      Pupils: Pupils are equal, round, and reactive to light.  Pulmonary:     Effort: Pulmonary effort is normal.  Abdominal:     Palpations: Abdomen is soft.  Musculoskeletal:        General: Normal range of motion.     Cervical back: Neck supple.  Skin:    General: Skin is warm.  Neurological:     Mental Status: He is alert and oriented to person, place, and time.  Psychiatric:        Behavior: Behavior normal.        Thought Content: Thought content normal.        Judgment: Judgment normal.     Ortho Exam Examination of the left knee shows no joint line tenderness or effusion.  Normal range of motion.  Negative McMurray.  Collaterals and cruciates are stable. Specialty Comments:  No specialty comments available.  Imaging: No results found.   PMFS History: Patient Active Problem List   Diagnosis Date Noted   Abdominal pain 12/07/2020   History of severe acute respiratory syndrome coronavirus 2 (SARS-CoV-2) disease 10/19/2020   Internal hemorrhoids 05/18/2019   Acute anal fissure 05/03/2019   Hypertriglyceridemia 05/03/2019   Dyspnea 04/02/2019   Loss of taste 04/02/2019   S/P right  knee arthroscopy 12/19/2018   Synovitis of right knee 12/12/2018   Acute medial meniscus tear of right knee 10/31/2018   History of diverticulitis 09/11/2018   Chronic pain of right knee 03/28/2018   Chronic left shoulder pain 03/28/2018   Allergic reaction to drug 07/18/2017   Past Medical History:  Diagnosis Date   Diverticulitis    Diverticulosis     Family History  Problem Relation Age of Onset   Parkinson's disease Mother    Diabetes Brother    Colon cancer Neg Hx    Colon polyps Neg Hx    Esophageal cancer Neg Hx    Rectal cancer Neg Hx    Stomach cancer Neg Hx     Past Surgical History:  Procedure Laterality Date   KNEE ARTHROSCOPY WITH MEDIAL MENISECTOMY Right 12/12/2018   Procedure: RIGHT KNEE ARTHROSCOPY WITH PARTIAL MEDIAL MENISCECTOMY;  Surgeon: Tarry Kos, MD;   Location: [facility];  Service: Orthopedics;  Laterality: Right;   Social History   Occupational History   Not on file  Tobacco Use   Smoking status: Former    Types: Cigarettes   Smokeless tobacco: Never  Vaping Use   Vaping Use: Never used  Substance and Sexual Activity   Alcohol use: Yes    Comment: social  1-2 times a month   Drug use: No   Sexual activity: Not on file

## 2022-09-20 ENCOUNTER — Ambulatory Visit: Payer: BC Managed Care – PPO | Admitting: Orthopaedic Surgery

## 2022-12-05 ENCOUNTER — Encounter: Payer: Self-pay | Admitting: Physician Assistant

## 2022-12-05 ENCOUNTER — Ambulatory Visit: Payer: BC Managed Care – PPO | Admitting: Physician Assistant

## 2022-12-05 VITALS — BP 120/80 | HR 61 | Ht 65.0 in | Wt 211.0 lb

## 2022-12-05 DIAGNOSIS — R1030 Lower abdominal pain, unspecified: Secondary | ICD-10-CM

## 2022-12-05 DIAGNOSIS — R0989 Other specified symptoms and signs involving the circulatory and respiratory systems: Secondary | ICD-10-CM

## 2022-12-05 MED ORDER — HYOSCYAMINE SULFATE 0.125 MG SL SUBL: 0.1250 mg | SUBLINGUAL_TABLET | SUBLINGUAL | 2 refills | Status: DC | PRN

## 2022-12-05 MED ORDER — FAMOTIDINE 20 MG PO TABS
20.0000 mg | ORAL_TABLET | Freq: Every day | ORAL | Status: DC
Start: 2022-12-05 — End: 2024-04-05

## 2022-12-05 NOTE — Progress Notes (Signed)
Reviewed and agree with management plans. Low threshold to consider EGD +/- cross-sectional imaging if abdominal pain continues.  Newell Frater L. Tarri Glenn, MD, MPH

## 2022-12-05 NOTE — Progress Notes (Signed)
Chief Complaint: Lower abdominal pain and needing to "clear his throat"  HPI:    [name] is a 52 year old Spanish-speaking male, known to Dr. Tarri Glenn, who was referred to me by Concha Pyo, PA for a complaint of lower abdominal pain and a need to clear his throat.      07/14/2021 colonoscopy for abdominal pain, rectal pain and fullness and history of diverticulitis with nonbleeding internal hemorrhoids, diverticulosis in the sigmoid and descending colon, erythematous mucosa in the sigmoid colon and otherwise normal.  Biopsies were normal.  Repeat recommended 10 years.    Today, the patient presents to clinic accompanied by his daughter and a Spanish interpreter, he explains that for the past 2 weeks when he eats he developed some pain in his lower abdomen across both quadrants, "not all the time but may be 2 to 3 days out of the week", oftentimes he will have a bowel movement then and this pain goes away but sometimes it does not.  This pain is a cramping rated as a 2-3/10.  No blood in his stool or change in bowel habits, he remains quite "regular".    Also discusses that at night for the past few months now occasionally he will have "phlegm", that comes up in his throat, it is not acidic and he does not typically have problems with heartburn or reflux.    Denies fever, chills, weight loss, blood in his stool, nausea or vomiting.  Past Medical History:  Diagnosis Date   Diverticulitis    Diverticulosis    Hyperlipidemia     Past Surgical History:  Procedure Laterality Date   KNEE ARTHROSCOPY WITH MEDIAL MENISECTOMY Right 12/12/2018   Procedure: RIGHT KNEE ARTHROSCOPY WITH PARTIAL MEDIAL MENISCECTOMY;  Surgeon: Leandrew Koyanagi, MD;  Location: [facility];  Service: Orthopedics;  Laterality: Right;    Current Outpatient Medications  Medication Sig Dispense Refill   atorvastatin (LIPITOR) 40 MG tablet Take 40 mg by mouth daily.     hydrocortisone (ANUSOL-HC) 2.5 %  rectal cream Place 1 application rectally 2 (two) times daily. 30 g 1   Psyllium 0.36 g CAPS Take 1 dose by mouth daily     Current Facility-Administered Medications  Medication Dose Route Frequency Provider Last Rate Last Admin   0.9 %  sodium chloride infusion  500 mL Intravenous Continuous Thornton Park, MD        Allergies as of 12/05/2022 - Review Complete 12/05/2022  Allergen Reaction Noted   Blue dyes (parenteral)  12/06/2018   Red dye  12/06/2018   Omeprazole Rash 12/25/2020   Voltaren [diclofenac] Rash 12/06/2018    Family History  Problem Relation Age of Onset   Parkinson's disease Mother    Diabetes Brother    Colon cancer Neg Hx    Colon polyps Neg Hx    Esophageal cancer Neg Hx    Rectal cancer Neg Hx    Stomach cancer Neg Hx     Social History   Socioeconomic History   Marital status: Single    Spouse name: Not on file   Number of children: Not on file   Years of education: Not on file   Highest education level: Not on file  Occupational History   Not on file  Tobacco Use   Smoking status: Former    Types: Cigarettes   Smokeless tobacco: Never  Vaping Use   Vaping Use: Never used  Substance and Sexual Activity   Alcohol use: Yes  Comment: social  1-2 times a month   Drug use: No   Sexual activity: Not on file  Other Topics Concern   Not on file  Social History Narrative   Not on file   Social Determinants of Health   Financial Resource Strain: Not on file  Food Insecurity: Not on file  Transportation Needs: Not on file  Physical Activity: Not on file  Stress: Not on file  Social Connections: Not on file  Intimate Partner Violence: Not on file    Review of Systems:    Constitutional: No weight loss, fever or chills Cardiovascular: No chest pain  Respiratory: No SOB Gastrointestinal: See HPI and otherwise negative   Physical Exam:  Vital signs: BP 120/80   Pulse 61   Ht 5' 5"$  (1.651 m)   Wt 211 lb (95.7 kg)   BMI 35.11  kg/m   Constitutional:   Pleasant Hispanic male appears to be in NAD, Well developed, Well nourished, alert and cooperative Respiratory: Respirations even and unlabored. Lungs clear to auscultation bilaterally.   No wheezes, crackles, or rhonchi.  Cardiovascular: Normal S1, S2. No MRG. Regular rate and rhythm. No peripheral edema, cyanosis or pallor.  Gastrointestinal:  Soft, nondistended, nontender. No rebound or guarding. Normal bowel sounds. No appreciable masses or hepatomegaly. Rectal:  Not performed.  Psychiatric: Demonstrates good judgement and reason without abnormal affect or behaviors.  No recent labs or imaging.  Assessment: 1.  Lower abdominal pain: For the past couple of weeks, typically precedes a bowel movement which does relieve the pain, but sometimes the pain lingers, recent colonoscopy; consider IBS 2.  Phlegm in throat: At night sometimes comes up in his throat, not acidic; consider GERD versus upper respiratory etiology  Plan: 1.  Discussed with patient that his lower abdominal pain could be related to irritable bowel given that it is relieved with a bowel movement typically.  Prescribed Hyoscyamine sulfate 0.125 mg sublingual tabs 1 tab every 4-6 hours as needed for lower abdominal pain.  #30 with 2 refills. 2.  Discussed phlegm in the patient's throat.  I am not entirely sure this has to do with reflux, but we will trial Famotidine 20 mg nightly.  Patient can buy this over-the-counter and try it for 2 weeks to see if it decreases the symptoms.  If it does not I recommend he follows PCP to discuss upper respiratory issues, possibly sinus problems and may be using Flonase or other antihistamine. 3.  Patient to follow in clinic with [place] as needed.  Ellouise Newer, PA-C [facility] 12/05/2022, 10:57 AM  Cc: Concha Pyo, PA

## 2022-12-05 NOTE — Patient Instructions (Signed)
If your blood pressure at your visit was 140/90 or greater, please contact your primary care physician to follow up on this.  _______________________________________________________  If you are age 54 or older, your body mass index should be between 23-30. Your Body mass index is 35.11 kg/m. If this is out of the aforementioned range listed, please consider follow up with your Primary Care Provider.  If you are age 69 or younger, your body mass index should be between 19-25. Your Body mass index is 35.11 kg/m. If this is out of the aformentioned range listed, please consider follow up with your Primary Care Provider.   ________________________________________________________  The [place] [facility] providers would like to encourage you to use [facility] to communicate with providers for non-urgent requests or questions.  Due to long hold times on the telephone, sending your provider a message by [facility] may be a faster and more efficient way to get a response.  Please allow 48 business hours for a response.  Please remember that this is for non-urgent requests.  _______________________________________________________  We have sent the following medications to your pharmacy for you to pick up at your convenience: Hyoscyamine 0.125 mg SL: dissolve 1 tablet under your tongue every 4 to 6 hours as needed  Please purchase the following medications over the counter and take as directed: Pepcid (famotidine) 20 mg: Take daily at bedtime for 2 weeks  Please follow up in 6 to 8 weeks. The April schedule is not out yet.  Please call in a week or two to schedule an appointment:  [phone]. Thank you.   Thank you for entrusting me with your care and for choosing [employer], Ellouise Newer, P.A. - C.

## 2023-01-16 ENCOUNTER — Ambulatory Visit: Payer: BC Managed Care – PPO | Admitting: Physician Assistant

## 2023-02-02 ENCOUNTER — Ambulatory Visit: Payer: BC Managed Care – PPO | Admitting: Physician Assistant

## 2024-04-04 NOTE — Progress Notes (Signed)
 Chief Complaint:ab pain Primary GI Doctor: Previously Dr. Savannah Cox  HPI:  [name] is a 53 year old Spanish-speaking male, previously known to Dr. Savannah Cox, with past medical history of diverticulosis, GERD, and hyperlipidemia. Patient last seen in GI office by Zachary Campus, PA on 12/05/2022 for a complaint of lower abdominal pain and a need to clear his throat.     02/17/24 patient seen in ED at [facility] for abdominal pain.  Labs show: WBC 7.8, hemoglobin 13.5, platelets 226, normal LFTs, lipase normal. CT scan showed wall thickening and stranding in distal descending and sigmoid colon suggesting acute diverticulitis.  Patient prescribed Augmentin for 10 days     Past GI procedures   07/14/2021 colonoscopy for abdominal pain, rectal pain and fullness and history of diverticulitis with nonbleeding internal hemorrhoids, diverticulosis in the sigmoid and descending colon, erythematous mucosa in the sigmoid colon and otherwise normal.  Biopsies were normal.  Repeat recommended 10 years.     Interval History  Patient admits/denies GERD Patient admits/denies dysphagia Patient admits/denies nausea, vomiting, or weight loss  Patient admits/denies altered bowel habits Patient admits/denies abdominal pain Patient admits/denies rectal bleeding   Denies/Admits alcohol Denies/Admits smoking Denies/Admits NSAID use. Denies/Admits they are on blood thinners.  Patients last colonoscopy Patients last EGD  Patient's family history includes  Wt Readings from Last 3 Encounters:  12/05/22 211 lb (95.7 kg)  07/14/21 211 lb (95.7 kg)  07/08/21 211 lb (95.7 kg)      Past Medical History:  Diagnosis Date   Diverticulitis    Diverticulosis    Hyperlipidemia     Past Surgical History:  Procedure Laterality Date   KNEE ARTHROSCOPY WITH MEDIAL MENISECTOMY Right 12/12/2018   Procedure: RIGHT KNEE ARTHROSCOPY WITH PARTIAL MEDIAL MENISCECTOMY;  Surgeon: Zachary Hamman, MD;  Location: [facility];  Service: Orthopedics;  Laterality: Right;    Current Outpatient Medications  Medication Sig Dispense Refill   atorvastatin (LIPITOR) 40 MG tablet Take 40 mg by mouth daily.     famotidine  (PEPCID ) 20 MG tablet Take 1 tablet (20 mg total) by mouth at bedtime for 14 days.     hydrocortisone  (ANUSOL -HC) 2.5 % rectal cream Place 1 application rectally 2 (two) times daily. 30 g 1   hyoscyamine  (LEVSIN  SL) 0.125 MG SL tablet Place 1 tablet (0.125 mg total) under the tongue every 4 (four) hours as needed. 30 tablet 2   Psyllium 0.36 g CAPS Take 1 dose by mouth daily     Current Facility-Administered Medications  Medication Dose Route Frequency Provider Last Rate Last Admin   0.9 %  sodium chloride  infusion  500 mL Intravenous Continuous Zachary Rhea, MD        Allergies as of 04/05/2024 - Review Complete 12/05/2022  Allergen Reaction Noted   Blue dyes (parenteral)  12/06/2018   Red dye #40 (allura red)  12/06/2018   Omeprazole Rash 12/25/2020   Voltaren  [diclofenac ] Rash 12/06/2018    Family History  Problem Relation Age of Onset   Parkinson's disease Mother    Diabetes Brother    Colon cancer Neg Hx    Colon polyps Neg Hx    Esophageal cancer Neg Hx    Rectal cancer Neg Hx    Stomach cancer Neg Hx     Review of Systems:    Constitutional: No weight loss, fever, chills, weakness or fatigue HEENT: Eyes: No change in vision               Ears, Nose, Throat:  No change in hearing or congestion Skin: No rash or itching Cardiovascular: No chest pain, chest pressure or palpitations   Respiratory: No SOB or cough Gastrointestinal: See HPI and otherwise negative Genitourinary: No dysuria or change in urinary frequency Neurological: No headache, dizziness or syncope Musculoskeletal: No new muscle or joint pain Hematologic: No bleeding or bruising Psychiatric: No history of depression or anxiety    Physical Exam:  Vital signs: There were no vitals taken for  this visit.  Constitutional:   Pleasant *** male appears to be in NAD, Well developed, Well nourished, alert and cooperative Head:  Normocephalic and atraumatic. Eyes:   PEERL, EOMI. No icterus. Conjunctiva pink. Ears:  Normal auditory acuity. Neck:  Supple Throat: Oral cavity and pharynx without inflammation, swelling or lesion.  Respiratory: Respirations even and unlabored. Lungs clear to auscultation bilaterally.   No wheezes, crackles, or rhonchi.  Cardiovascular: Normal S1, S2. Regular rate and rhythm. No peripheral edema, cyanosis or pallor.  Gastrointestinal:  Soft, nondistended, nontender. No rebound or guarding. Normal bowel sounds. No appreciable masses or hepatomegaly. Rectal:  Not performed.  Anoscopy: Msk:  Symmetrical without gross deformities. Without edema, no deformity or joint abnormality.  Neurologic:  Alert and  oriented x4;  grossly normal neurologically.  Skin:   Dry and intact without significant lesions or rashes. Psychiatric: Oriented to person, place and time. Demonstrates good judgement and reason without abnormal affect or behaviors.  RELEVANT LABS AND IMAGING: CBC    Latest Ref Rng & Units 07/08/2021   11:54 AM 11/09/2015    8:43 PM 03/01/2011    1:00 PM  CBC  WBC 4.0 - 10.5 K/uL 7.6  10.8  17.5   Hemoglobin 13.0 - 17.0 g/dL 96.0  45.4  09.8   Hematocrit 39.0 - 52.0 % 44.7  44.2  41.7   Platelets 150.0 - 400.0 K/uL 247.0  265  246      CMP     Latest Ref Rng & Units 07/08/2021   11:54 AM 11/09/2015    8:43 PM 03/01/2011    1:00 PM  CMP  Glucose 70 - 99 mg/dL 119  147  829   BUN 6 - 23 mg/dL 12  11  13    Creatinine 0.40 - 1.50 mg/dL 5.62  1.30  8.65   Sodium 135 - 145 mEq/L 139  138  136   Potassium 3.5 - 5.1 mEq/L 4.5  3.9  4.3   Chloride 96 - 112 mEq/L 105  102  98   CO2 19 - 32 mEq/L 23  26  28    Calcium 8.4 - 10.5 mg/dL 78.4  9.5  9.9   Total Protein 6.0 - 8.3 g/dL 7.5  7.9  7.9   Total Bilirubin 0.2 - 1.2 mg/dL 1.2  0.7  1.1   Alkaline  Phos 39 - 117 U/L 61  67  67   AST 0 - 37 U/L 20  22  17    ALT 0 - 53 U/L 20  29  21      03/15/2024 CT abdomen pelvis with IV contrast Impression:  IMPRESSION: There are multiple colonic diverticula seen. There is wall thickening and stranding at the distal descending and sigmoid colon suggesting acute diverticulitis.  Assessment: 1. ***  Plan: 1. ***   Thank you for the courtesy of this consult. Please call me with any questions or concerns.   Rilee Wendling, FNP-C [facility] 04/04/2024, 4:38 PM  Cc: Gonzalez, Rodalyn, PA

## 2024-04-05 ENCOUNTER — Ambulatory Visit: Admitting: Gastroenterology

## 2024-04-05 ENCOUNTER — Encounter: Payer: Self-pay | Admitting: Gastroenterology

## 2024-04-05 VITALS — BP 130/80 | HR 61 | Ht 65.0 in | Wt 205.0 lb

## 2024-04-05 DIAGNOSIS — K219 Gastro-esophageal reflux disease without esophagitis: Secondary | ICD-10-CM | POA: Diagnosis not present

## 2024-04-05 DIAGNOSIS — K648 Other hemorrhoids: Secondary | ICD-10-CM | POA: Diagnosis not present

## 2024-04-05 DIAGNOSIS — R109 Unspecified abdominal pain: Secondary | ICD-10-CM

## 2024-04-05 DIAGNOSIS — Z8719 Personal history of other diseases of the digestive system: Secondary | ICD-10-CM | POA: Diagnosis not present

## 2024-04-05 MED ORDER — HYOSCYAMINE SULFATE 0.125 MG SL SUBL: 0.1250 mg | SUBLINGUAL_TABLET | Freq: Four times a day (QID) | SUBLINGUAL | 2 refills | Status: AC | PRN

## 2024-04-05 MED ORDER — FAMOTIDINE 20 MG PO TABS: 20.0000 mg | ORAL_TABLET | Freq: Two times a day (BID) | ORAL | 2 refills | Status: DC

## 2024-04-05 NOTE — Patient Instructions (Addendum)
 Recommend high fiber diet Recommend GERD diet, no late meals 3-4 hours before lying down Pepcid  20mg  as needed for heartburn, can take up to twice a day Hyoscyamine  0.125 mg as needed for abdominal pain  _______________________________________________________  If your blood pressure at your visit was 140/90 or greater, please contact your primary care physician to follow up on this.  _______________________________________________________  If you are age 44 or older, your body mass index should be between 23-30. Your Body mass index is 34.11 kg/m. If this is out of the aforementioned range listed, please consider follow up with your Primary Care Provider.  If you are age 37 or younger, your body mass index should be between 19-25. Your Body mass index is 34.11 kg/m. If this is out of the aformentioned range listed, please consider follow up with your Primary Care Provider.   ________________________________________________________  The [place] [facility] providers would like to encourage you to use [facility] to communicate with providers for non-urgent requests or questions.  Due to long hold times on the telephone, sending your provider a message by [facility] may be a faster and more efficient way to get a response.  Please allow 48 business hours for a response.  Please remember that this is for non-urgent requests.  _______________________________________________________   It was a pleasure to see you today!  Thank you for trusting me with your gastrointestinal care!

## 2024-07-10 ENCOUNTER — Other Ambulatory Visit: Payer: Self-pay | Admitting: Gastroenterology

## 2024-07-10 DIAGNOSIS — K219 Gastro-esophageal reflux disease without esophagitis: Secondary | ICD-10-CM
